# Patient Record
Sex: Female | Born: 1995 | Race: White | Hispanic: No | Marital: Married | Smoking: Never smoker
Health system: Southern US, Community
[De-identification: ages and names within clinical notes are randomized; demographics above are authoritative.]

## PROBLEM LIST (undated history)

## (undated) ENCOUNTER — Inpatient Hospital Stay (HOSPITAL_COMMUNITY): Payer: Self-pay

## (undated) ENCOUNTER — Ambulatory Visit (HOSPITAL_COMMUNITY): Admission: EM | Discharge status: Against Medical Advice | Payer: BLUE CROSS/BLUE SHIELD | Source: Ambulatory Visit

## (undated) DIAGNOSIS — Z789 Other specified health status: Secondary | ICD-10-CM

## (undated) DIAGNOSIS — I10 Essential (primary) hypertension: Secondary | ICD-10-CM

## (undated) HISTORY — PX: NO PAST SURGERIES: SHX2092

## (undated) HISTORY — DX: Other specified health status: Z78.9

---
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
## 2016-07-05 ENCOUNTER — Ambulatory Visit (INDEPENDENT_AMBULATORY_CARE_PROVIDER_SITE_OTHER): Payer: Managed Care, Other (non HMO) | Admitting: Pediatrics

## 2017-05-06 ENCOUNTER — Inpatient Hospital Stay (HOSPITAL_COMMUNITY)
Admission: AD | Admit: 2017-05-06 | Discharge: 2017-05-06 | Disposition: A | Discharge status: Home / Self Care | Payer: BLUE CROSS/BLUE SHIELD | Source: Ambulatory Visit | Attending: Obstetrics and Gynecology | Admitting: Obstetrics and Gynecology

## 2017-05-06 DIAGNOSIS — R109 Unspecified abdominal pain: Secondary | ICD-10-CM

## 2017-05-06 DIAGNOSIS — R1030 Lower abdominal pain, unspecified: Secondary | ICD-10-CM | POA: Insufficient documentation

## 2017-05-06 DIAGNOSIS — R110 Nausea: Secondary | ICD-10-CM

## 2017-05-06 LAB — COMPREHENSIVE METABOLIC PANEL
ALT: 16 U/L (ref 14–54)
AST: 20 U/L (ref 15–41)
Albumin: 4.6 g/dL (ref 3.5–5.0)
Alkaline Phosphatase: 83 U/L (ref 38–126)
Anion gap: 9 (ref 5–15)
BILIRUBIN TOTAL: 0.5 mg/dL (ref 0.3–1.2)
BUN: 13 mg/dL (ref 6–20)
CALCIUM: 9.5 mg/dL (ref 8.9–10.3)
CO2: 24 mmol/L (ref 22–32)
CREATININE: 0.73 mg/dL (ref 0.44–1.00)
Chloride: 101 mmol/L (ref 101–111)
Glucose, Bld: 105 mg/dL — ABNORMAL HIGH (ref 65–99)
Potassium: 3.7 mmol/L (ref 3.5–5.1)
Sodium: 134 mmol/L — ABNORMAL LOW (ref 135–145)
Total Protein: 7.5 g/dL (ref 6.5–8.1)

## 2017-05-06 LAB — CBC WITH DIFFERENTIAL/PLATELET
BASOS ABS: 0 10*3/uL (ref 0.0–0.1)
Basophils Relative: 0 %
EOS PCT: 1 %
Eosinophils Absolute: 0.1 10*3/uL (ref 0.0–0.7)
HEMATOCRIT: 39.6 % (ref 36.0–46.0)
Hemoglobin: 14.2 g/dL (ref 12.0–15.0)
LYMPHS ABS: 2.2 10*3/uL (ref 0.7–4.0)
LYMPHS PCT: 27 %
MCH: 28.9 pg (ref 26.0–34.0)
MCHC: 35.9 g/dL (ref 30.0–36.0)
MCV: 80.7 fL (ref 78.0–100.0)
MONO ABS: 0.3 10*3/uL (ref 0.1–1.0)
Monocytes Relative: 4 %
NEUTROS ABS: 5.6 10*3/uL (ref 1.7–7.7)
Neutrophils Relative %: 68 %
PLATELETS: 289 10*3/uL (ref 150–400)
RBC: 4.91 MIL/uL (ref 3.87–5.11)
RDW: 11.8 % (ref 11.5–15.5)
WBC: 8.3 10*3/uL (ref 4.0–10.5)

## 2017-05-06 LAB — URINALYSIS, ROUTINE W REFLEX MICROSCOPIC
BILIRUBIN URINE: NEGATIVE
GLUCOSE, UA: NEGATIVE mg/dL
HGB URINE DIPSTICK: NEGATIVE
Ketones, ur: NEGATIVE mg/dL
Leukocytes, UA: NEGATIVE
Nitrite: NEGATIVE
PH: 6 (ref 5.0–8.0)
Protein, ur: NEGATIVE mg/dL
Specific Gravity, Urine: 1.01 (ref 1.005–1.030)

## 2017-05-06 LAB — POCT PREGNANCY, URINE: PREG TEST UR: NEGATIVE

## 2017-05-06 MED ORDER — PROMETHAZINE HCL 12.5 MG PO TABS: 12.5000 mg | ORAL_TABLET | Freq: Four times a day (QID) | ORAL | 0 refills | Status: DC | PRN

## 2017-05-06 MED ORDER — KETOROLAC TROMETHAMINE 10 MG PO TABS: 10.0000 mg | ORAL_TABLET | Freq: Four times a day (QID) | ORAL | 0 refills | Status: DC | PRN

## 2017-05-06 MED ORDER — KETOROLAC TROMETHAMINE 60 MG/2ML IM SOLN
60.0000 mg | Freq: Once | INTRAMUSCULAR | Status: AC
  Administered 2017-05-06: 60 mg via INTRAMUSCULAR
  Filled 2017-05-06: qty 2

## 2017-05-06 NOTE — MAU Provider Note (Signed)
History     CSN: [id number]  Arrival date and time: 05/06/17 2000   First Provider Initiated Contact with Patient 05/06/17 2052      Chief Complaint  Patient presents with  . Abdominal Pain   HPI   [name] is a 22 y.o. female No obstetric history on file. Here in MAU with complaints of left lower quadrant pain. The pain started Thursday afternoon. The pain is in her LLQ, the pain comes and goes. When her bladder is full or she has to have a BM the pressure and pain becomes worse. Says she took 3 motrin yesterday. That helped the pain some. Says she has a history of an ovarian cyst that was the size of a quarter. It went away on own. No history of surgery for cysts. No fever.   OB History   None     No past medical history on file.  No past surgical history on file.  No family history on file.  Social History   Tobacco Use  . Smoking status: Never Smoker  . Smokeless tobacco: Never Used  Substance Use Topics  . Alcohol use: No  . Drug use: No    Allergies: No Known Allergies  Medications Prior to Admission  Medication Sig Dispense Refill Last Dose  . prednisoLONE acetate (PRED FORTE) 1 % ophthalmic suspension    Unknown at Unknown time   Results for orders placed or performed during the hospital encounter of 05/06/17 (from the past 48 hour(s))  Urinalysis, Routine w reflex microscopic     Status: Abnormal   Collection Time: 05/06/17  8:10 PM  Result Value Ref Range   Color, Urine STRAW (A) YELLOW   APPearance CLEAR CLEAR   Specific Gravity, Urine 1.010 1.005 - 1.030   pH 6.0 5.0 - 8.0   Glucose, UA NEGATIVE NEGATIVE mg/dL   Hgb urine dipstick NEGATIVE NEGATIVE   Bilirubin Urine NEGATIVE NEGATIVE   Ketones, ur NEGATIVE NEGATIVE mg/dL   Protein, ur NEGATIVE NEGATIVE mg/dL   Nitrite NEGATIVE NEGATIVE   Leukocytes, UA NEGATIVE NEGATIVE    Comment: Performed at [facility], [address], [place],  [address]  Pregnancy, urine POC      Status: None   Collection Time: 05/06/17  8:27 PM  Result Value Ref Range   Preg Test, Ur NEGATIVE NEGATIVE    Comment:        THE SENSITIVITY OF THIS METHODOLOGY IS >24 mIU/mL   CBC with Differential     Status: None   Collection Time: 05/06/17  9:07 PM  Result Value Ref Range   WBC 8.3 4.0 - 10.5 K/uL   RBC 4.91 3.87 - 5.11 MIL/uL   Hemoglobin 14.2 12.0 - 15.0 g/dL   HCT 39.6 36.0 - 46.0 %   MCV 80.7 78.0 - 100.0 fL   MCH 28.9 26.0 - 34.0 pg   MCHC 35.9 30.0 - 36.0 g/dL   RDW 11.8 11.5 - 15.5 %   Platelets 289 150 - 400 K/uL   Neutrophils Relative % 68 %   Neutro Abs 5.6 1.7 - 7.7 K/uL   Lymphocytes Relative 27 %   Lymphs Abs 2.2 0.7 - 4.0 K/uL   Monocytes Relative 4 %   Monocytes Absolute 0.3 0.1 - 1.0 K/uL   Eosinophils Relative 1 %   Eosinophils Absolute 0.1 0.0 - 0.7 K/uL   Basophils Relative 0 %   Basophils Absolute 0.0 0.0 - 0.1 K/uL    Comment: Performed at  [facility], [place], [place] [address]  Comprehensive metabolic panel     Status: Abnormal   Collection Time: 05/06/17  9:07 PM  Result Value Ref Range   Sodium 134 (L) 135 - 145 mmol/L   Potassium 3.7 3.5 - 5.1 mmol/L   Chloride 101 101 - 111 mmol/L   CO2 24 22 - 32 mmol/L   Glucose, Bld 105 (H) 65 - 99 mg/dL   BUN 13 6 - 20 mg/dL   Creatinine, Ser 0.73 0.44 - 1.00 mg/dL   Calcium 9.5 8.9 - 10.3 mg/dL   Total Protein 7.5 6.5 - 8.1 g/dL   Albumin 4.6 3.5 - 5.0 g/dL   AST 20 15 - 41 U/L   ALT 16 14 - 54 U/L   Alkaline Phosphatase 83 38 - 126 U/L   Total Bilirubin 0.5 0.3 - 1.2 mg/dL   GFR calc non Af Amer >60 >60 mL/min   GFR calc Af Amer >60 >60 mL/min    Comment: (NOTE) The eGFR has been calculated using the CKD EPI equation. This calculation has not been validated in all clinical situations. eGFR's persistently <60 mL/min signify possible Chronic Kidney Disease.    Anion gap 9 5 - 15    Comment: Performed at [facility], [address], [place], [place] [address]     Review of Systems  Constitutional: Positive for fever.  Gastrointestinal: Positive for abdominal pain. Negative for nausea and vomiting.  Genitourinary: Positive for pelvic pain. Negative for decreased urine volume, dysuria, vaginal bleeding and vaginal discharge.   Physical Exam   Blood pressure (!) 143/81, pulse 94, temperature 98.4 F (36.9 C), resp. rate 17, height 5' 4" (1.626 m), weight 154 lb 4 oz (70 kg), last menstrual period 04/19/2017, SpO2 100 %.  Physical Exam  Constitutional: She is oriented to person, place, and time. She appears well-developed and well-nourished.  Non-toxic appearance. She does not have a sickly appearance. She does not appear ill. No distress.  HENT:  Head: Normocephalic.  Eyes: Pupils are equal, round, and reactive to light.  GI: Soft. Normal appearance. There is tenderness in the suprapubic area and left lower quadrant. There is no rigidity, no rebound and no guarding.  Genitourinary:  Genitourinary Comments: Bimanual exam: Cervix closed Uterus non tender, Left adnexal tenderness  no masses bilaterally Chaperone present for exam.   Musculoskeletal: Normal range of motion.  Neurological: She is alert and oriented to person, place, and time.  Skin: Skin is warm. She is not diaphoretic.  Psychiatric: Her behavior is normal.    MAU Course  Procedures  None  MDM  CBC without leukocytosis  Toradol given 60 mg IM Patient says Toradol helped her pain tremendously. Rates her pain 1/10 down from 8/10. Patient's mother requesting [place] tonight. Discussed with Dr. Helane Rima who would like the patient to come to the office this week for follow-up and possibly in office [place]. Ok for DC home   Assessment and Plan   A:  1. Abdominal pain in female   2. Nausea     P:  Discharge home in stable condition Rx: Toradol X 3 days only, Phenergan Return to MAU if pain or symptoms worsen Call the office Monday morning to schedule follow up  Noni Saupe I, NP 05/08/2017 8:41 AM

## 2017-05-06 NOTE — MAU Note (Addendum)
Hx of intrauterine cysts-last cyst was last summer.  Left lower abdominal pain started Thursday.  Took motrin on Thursday and excedrin today (1900) for the pain-no relief.  States her leg goes numb when it gets intense.  Can feel it when she urinates.  No urgency/burning when she urinates.  LMP 04/19/2017.

## 2017-05-06 NOTE — Discharge Instructions (Signed)

## 2017-05-31 ENCOUNTER — Emergency Department (HOSPITAL_COMMUNITY)
Admission: EM | Admit: 2017-05-31 | Discharge: 2017-05-31 | Discharge status: Against Medical Advice | Payer: BLUE CROSS/BLUE SHIELD

## 2018-01-10 ENCOUNTER — Encounter (INDEPENDENT_AMBULATORY_CARE_PROVIDER_SITE_OTHER): Payer: Self-pay | Admitting: Physician Assistant

## 2018-01-10 DIAGNOSIS — Z111 Encounter for screening for respiratory tuberculosis: Secondary | ICD-10-CM

## 2018-01-11 NOTE — Progress Notes (Signed)
This encounter was created in error - please disregard.

## 2018-10-31 ENCOUNTER — Ambulatory Visit: Payer: Managed Care, Other (non HMO) | Admitting: Family Medicine

## 2018-11-02 ENCOUNTER — Encounter: Payer: Self-pay | Admitting: General Practice

## 2020-04-13 ENCOUNTER — Emergency Department (HOSPITAL_BASED_OUTPATIENT_CLINIC_OR_DEPARTMENT_OTHER): Payer: Commercial Managed Care - PPO | Admitting: Radiology

## 2020-04-13 ENCOUNTER — Encounter (HOSPITAL_BASED_OUTPATIENT_CLINIC_OR_DEPARTMENT_OTHER): Payer: Self-pay | Admitting: Emergency Medicine

## 2020-04-13 ENCOUNTER — Emergency Department (HOSPITAL_BASED_OUTPATIENT_CLINIC_OR_DEPARTMENT_OTHER): Payer: Commercial Managed Care - PPO

## 2020-04-13 ENCOUNTER — Other Ambulatory Visit: Payer: Self-pay

## 2020-04-13 ENCOUNTER — Emergency Department (HOSPITAL_BASED_OUTPATIENT_CLINIC_OR_DEPARTMENT_OTHER)
Admission: EM | Admit: 2020-04-13 | Discharge: 2020-04-13 | Disposition: A | Discharge status: Home / Self Care | Payer: Commercial Managed Care - PPO | Attending: Emergency Medicine | Admitting: Emergency Medicine

## 2020-04-13 DIAGNOSIS — R519 Headache, unspecified: Secondary | ICD-10-CM | POA: Diagnosis not present

## 2020-04-13 DIAGNOSIS — R55 Syncope and collapse: Secondary | ICD-10-CM | POA: Diagnosis not present

## 2020-04-13 DIAGNOSIS — S21311A Laceration without foreign body of right front wall of thorax with penetration into thoracic cavity, initial encounter: Secondary | ICD-10-CM | POA: Diagnosis not present

## 2020-04-13 DIAGNOSIS — S40011A Contusion of right shoulder, initial encounter: Secondary | ICD-10-CM | POA: Diagnosis not present

## 2020-04-13 DIAGNOSIS — S2191XA Laceration without foreign body of unspecified part of thorax, initial encounter: Secondary | ICD-10-CM

## 2020-04-13 DIAGNOSIS — T148XXA Other injury of unspecified body region, initial encounter: Secondary | ICD-10-CM

## 2020-04-13 DIAGNOSIS — M542 Cervicalgia: Secondary | ICD-10-CM | POA: Insufficient documentation

## 2020-04-13 DIAGNOSIS — W01198A Fall on same level from slipping, tripping and stumbling with subsequent striking against other object, initial encounter: Secondary | ICD-10-CM | POA: Diagnosis not present

## 2020-04-13 DIAGNOSIS — S299XXA Unspecified injury of thorax, initial encounter: Secondary | ICD-10-CM | POA: Diagnosis present

## 2020-04-13 LAB — CBC WITH DIFFERENTIAL/PLATELET
Abs Immature Granulocytes: 0.02 10*3/uL (ref 0.00–0.07)
Basophils Absolute: 0 10*3/uL (ref 0.0–0.1)
Basophils Relative: 1 %
Eosinophils Absolute: 0 10*3/uL (ref 0.0–0.5)
Eosinophils Relative: 1 %
HCT: 44.7 % (ref 36.0–46.0)
Hemoglobin: 15.2 g/dL — ABNORMAL HIGH (ref 12.0–15.0)
Immature Granulocytes: 0 %
Lymphocytes Relative: 23 %
Lymphs Abs: 1.3 10*3/uL (ref 0.7–4.0)
MCH: 28.8 pg (ref 26.0–34.0)
MCHC: 34 g/dL (ref 30.0–36.0)
MCV: 84.8 fL (ref 80.0–100.0)
Monocytes Absolute: 0.6 10*3/uL (ref 0.1–1.0)
Monocytes Relative: 10 %
Neutro Abs: 3.7 10*3/uL (ref 1.7–7.7)
Neutrophils Relative %: 65 %
Platelets: 260 10*3/uL (ref 150–400)
RBC: 5.27 MIL/uL — ABNORMAL HIGH (ref 3.87–5.11)
RDW: 11.6 % (ref 11.5–15.5)
WBC: 5.7 10*3/uL (ref 4.0–10.5)
nRBC: 0 % (ref 0.0–0.2)

## 2020-04-13 LAB — BASIC METABOLIC PANEL
Anion gap: 7 (ref 5–15)
BUN: 10 mg/dL (ref 6–20)
CO2: 28 mmol/L (ref 22–32)
Calcium: 9.2 mg/dL (ref 8.9–10.3)
Chloride: 104 mmol/L (ref 98–111)
Creatinine, Ser: 0.75 mg/dL (ref 0.44–1.00)
GFR, Estimated: >60 mL/min (ref >60)
Glucose, Bld: 95 mg/dL (ref 70–99)
Potassium: 3.8 mmol/L (ref 3.5–5.1)
Sodium: 139 mmol/L (ref 135–145)

## 2020-04-13 LAB — PREGNANCY, URINE: Preg Test, Ur: NEGATIVE

## 2020-04-13 MED ORDER — ONDANSETRON 4 MG PO TBDP
4.0000 mg | ORAL_TABLET | Freq: Once | ORAL | Status: AC
  Administered 2020-04-13: 4 mg via ORAL
  Filled 2020-04-13: qty 1

## 2020-04-13 MED ORDER — LIDOCAINE-EPINEPHRINE (PF) 2 %-1:200000 IJ SOLN
10.0000 mL | Freq: Once | INTRAMUSCULAR | Status: AC
  Administered 2020-04-13: 10 mL
  Filled 2020-04-13: qty 20

## 2020-04-13 NOTE — ED Triage Notes (Addendum)
Pt states  Got up and felt nauseous ,  Passed out and hit  Head  She thinks ,    And rt clavicle pain, with swelling ,  With small  Lac to rt upper chest small amt oozing at this timept states she is 9 days late with her period , pt walked  To room without difficulty,  States has been dizzy since last thursday

## 2020-04-13 NOTE — Discharge Instructions (Signed)
You were seen in the emergency department for evaluation of injuries after a fainting spell which she fell and struck your right collarbone.  X-rays of your shoulder and collarbone were negative for fracture.  CAT scan of your head and cervical spine also did not show any fractures or internal bleeding.  Your wound was sutured and these will need to be removed in 7 to 10 days.  You may get them wet.  Recommend that you drink plenty of fluids and rest today.  You can use ice to the affected areas.  Return to the emerge department if any worsening or concerning symptoms.

## 2020-04-13 NOTE — ED Provider Notes (Signed)
[facility] EMERGENCY DEPT Provider Note   CSN: [id number] Arrival date & time: 04/13/20  2633     History Chief Complaint  Patient presents with  . Fall    Bianca Mccarthy is a 25 y.o. female.  She has no significant past medical history.  She said she felt lightheaded when she woke up this morning.  She felt like she was going to pass out and then she woke up on the floor.  Complaining of headache neck pain and right shoulder pain.  Has a laceration deformity over her mid clavicle.  Currently does not feel dizzy.  Moderate throbbing headache.  She is also 9 days late on her period.  The history is provided by the patient.  Loss of Consciousness Episode history:  Single Most recent episode:  Today Progression:  Resolved Chronicity:  New Context: standing up   Witnessed: no   Relieved by:  None tried Worsened by:  Nothing Ineffective treatments:  None tried Associated symptoms: dizziness and headaches   Associated symptoms: no chest pain, no fever and no shortness of breath        History reviewed. No pertinent past medical history.  Patient Active Problem List   Diagnosis Date Noted  . 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 No pertinent surgical history.   OB History   No obstetric history on file.     History reviewed. No pertinent family history.  Social History   Tobacco Use  . Smoking status: Never Smoker  . Smokeless tobacco: Never Used  Substance Use Topics  . Alcohol use: Yes  . Drug use: No    Home Medications Prior to Admission medications   Medication Sig Start Date End Date Taking? Authorizing Provider  ketorolac (TORADOL) 10 MG tablet Take 1 tablet (10 mg total) by mouth every 6 (six) hours as needed for moderate pain. 05/06/17   Rasch, Victorino Dike I, NP  prednisoLONE acetate (PRED FORTE) 1 % ophthalmic suspension  09/21/13   [provider]  promethazine (PHENERGAN) 12.5 MG tablet Take 1 tablet (12.5  mg total) by mouth every 6 (six) hours as needed for nausea or vomiting. 05/06/17   Rasch, Harolyn Rutherford, NP    Allergies    Patient has no known allergies.  Review of Systems   Review of Systems  Constitutional: Negative for fever.  HENT: Negative for sore throat.   Eyes: Negative for visual disturbance.  Respiratory: Negative for shortness of breath.   Cardiovascular: Positive for syncope. Negative for chest pain.  Gastrointestinal: Negative for abdominal pain.  Genitourinary: Negative for dysuria.  Musculoskeletal: Positive for neck pain.  Skin: Positive for wound. Negative for rash.  Neurological: Positive for dizziness and headaches.    Physical Exam Updated Vital Signs BP (!) 131/94 (BP Location: Right Arm)   Pulse (!) 101   Temp 97.6 F (36.4 C) (Oral)   Resp 20   Ht 5\' 4"  (1.626 m)   Wt 72.6 kg   SpO2 100%   BMI 27.46 kg/m   Physical Exam Vitals and nursing note reviewed.  Constitutional:      General: She is not in acute distress.    Appearance: Normal appearance. She is well-developed.  HENT:     Head: Normocephalic and atraumatic.  Eyes:     Conjunctiva/sclera: Conjunctivae normal.  Cardiovascular:     Rate and Rhythm: Normal rate and regular rhythm.     Heart sounds: No murmur heard.   Pulmonary:  Effort: Pulmonary effort is normal. No respiratory distress.     Breath sounds: Normal breath sounds.  Abdominal:     Palpations: Abdomen is soft.     Tenderness: There is no abdominal tenderness. There is no guarding or rebound.  Musculoskeletal:        General: Tenderness and signs of injury present.     Cervical back: Neck supple.     Comments: She has approximately 3 cm superficial laceration over her mid right clavicle.  There is a hematoma there.  She has tenderness across the clavicle and right shoulder.  There is also some posterior neck pain.  Other extremities full range of motion without any pain or limitations.  Distal neurovascular intact.   Skin:    General: Skin is warm and dry.     Capillary Refill: Capillary refill takes less than 2 seconds.  Neurological:     General: No focal deficit present.     Mental Status: She is alert and oriented to person, place, and time.     Sensory: No sensory deficit.     Motor: No weakness.     Gait: Gait normal.     ED Results / Procedures / Treatments   Labs (all labs ordered are listed, but only abnormal results are displayed) Labs Reviewed  CBC WITH DIFFERENTIAL/PLATELET - Abnormal; Notable for the following components:      Result Value   RBC 5.27 (*)    Hemoglobin 15.2 (*)    All other components within normal limits  PREGNANCY, URINE  BASIC METABOLIC PANEL    EKG EKG Interpretation  Date/Time:  Monday April 13 2020 08:19:08 EDT Ventricular Rate:  105 PR Interval:  153 QRS Duration: 84 QT Interval:  327 QTC Calculation: 433 R Axis:   84 Text Interpretation: Sinus tachycardia Borderline T abnormalities, inferior leads No old tracing to compare Confirmed by Meridee Score [phone]) on 04/13/2020 8:32:12 AM   Radiology DG Clavicle Right  Result Date: 04/13/2020 CLINICAL DATA:  Recent fall with shoulder pain, initial encounter EXAM: RIGHT CLAVICLE - 2+ VIEWS COMPARISON:  None. FINDINGS: There is no evidence of fracture or other focal bone lesions. Soft tissues are unremarkable. IMPRESSION: No acute abnormality noted. Electronically Signed   By: Alcide Clever M.D.   On: 04/13/2020 09:19   DG Shoulder Right  Result Date: 04/13/2020 CLINICAL DATA:  Recent fall with shoulder pain, initial encounter EXAM: RIGHT SHOULDER - 2+ VIEW COMPARISON:  None. FINDINGS: There is no evidence of fracture or dislocation. There is no evidence of arthropathy or other focal bone abnormality. Soft tissues are unremarkable. IMPRESSION: No acute abnormality noted. Electronically Signed   By: Alcide Clever M.D.   On: 04/13/2020 09:10   CT Head Wo Contrast  Result Date: 04/13/2020 CLINICAL DATA:   Syncope, hit head EXAM: CT HEAD WITHOUT CONTRAST TECHNIQUE: Contiguous axial images were obtained from the base of the skull through the vertex without intravenous contrast. COMPARISON:  None FINDINGS: Brain: No acute intracranial abnormality. Specifically, no hemorrhage, hydrocephalus, mass lesion, acute infarction, or significant intracranial injury. Vascular: No hyperdense vessel or unexpected calcification. Skull: No acute calvarial abnormality. Sinuses/Orbits: No acute findings Other: None IMPRESSION: No acute intracranial abnormality. Electronically Signed   By: Charlett Nose M.D.   On: 04/13/2020 09:32   CT Cervical Spine Wo Contrast  Result Date: 04/13/2020 CLINICAL DATA:  Syncope EXAM: CT CERVICAL SPINE WITHOUT CONTRAST TECHNIQUE: Multidetector CT imaging of the cervical spine was performed without intravenous contrast. Multiplanar CT image  reconstructions were also generated. COMPARISON:  None. FINDINGS: Alignment: Loss of cervical lordosis. Slight anterolisthesis of C3 on C4 and C4 on C5. Skull base and vertebrae: No fracture or focal bone lesion. Soft tissues and spinal canal: No prevertebral fluid or swelling. No visible canal hematoma. Disc levels:  Maintained Upper chest: Negative Other: None IMPRESSION: Loss of cervical lordosis with slight anterolisthesis at C3-4 and C4-5. This may be related to muscle spasm. Consider further evaluation with flexion and extension views to exclude instability. No focal bone abnormality. Electronically Signed   By: Charlett Nose M.D.   On: 04/13/2020 09:34    Procedures .Marland KitchenLaceration Repair  Date/Time: 04/13/2020 9:46 AM Performed by: Terrilee Files, MD Authorized by: Terrilee Files, MD   Consent:    Consent obtained:  Verbal   Consent given by:  Patient   Risks discussed:  Infection, pain, poor cosmetic result, poor wound healing and retained foreign body   Alternatives discussed:  No treatment and delayed treatment Universal protocol:     Procedure explained and questions answered to patient or proxy's satisfaction: yes   Anesthesia:    Anesthesia method:  Local infiltration   Local anesthetic:  Lidocaine 2% WITH epi Laceration details:    Location:  Trunk   Trunk location:  R chest   Length (cm):  3 Pre-procedure details:    Preparation:  Patient was prepped and draped in usual sterile fashion Treatment:    Area cleansed with:  Saline   Amount of cleaning:  Standard   Irrigation solution:  Sterile saline   Debridement:  None Skin repair:    Repair method:  Sutures   Suture size:  5-0 and 4-0   Suture material:  Nylon   Suture technique:  Simple interrupted Approximation:    Approximation:  Close Repair type:    Repair type:  Simple Post-procedure details:    Dressing:  Non-adherent dressing   Procedure completion:  Tolerated well, no immediate complications     Medications Ordered in ED Medications  ondansetron (ZOFRAN-ODT) disintegrating tablet 4 mg (4 mg Oral Given 04/13/20 0835)  lidocaine-EPINEPHrine (XYLOCAINE W/EPI) 2 %-1:200000 (PF) injection 10 mL (10 mLs Infiltration Given 04/13/20 1020)    ED Course  I have reviewed the triage vital signs and the nursing notes.  Pertinent labs & imaging results that were available during my care of the patient were reviewed by me and considered in my medical decision making (see chart for details).  Clinical Course as of 04/13/20 0946  Mon Apr 13, 2020  [id number] Imaging ordered and interpreted by me as no acute fractures, no intracranial bleed.  Reviewed with patient.  She received some Zofran for nausea and she says that is improving [MB]    Clinical Course User Index [MB] Terrilee Files, MD   MDM Rules/Calculators/A&P                         This patient complains of syncope, fall, right clavicle injury, headache, neck pain; this involves an extensive number of treatment Options and is a complaint that carries with it a high risk of complications  and Morbidity. The differential includes fracture, contusion, dislocation, laceration, intracranial bleed, arrhythmia, anemia, metabolic derangement  I ordered, reviewed and interpreted labs, which included CBC unremarkable, chemistries unremarkable, pregnancy test negative I ordered medication oral Zofran with improvement in nausea I ordered imaging studies which included right shoulder and clavicle, CT head and cervical spine and I independently  visualized and interpreted imaging which showed no acute findings Additional history obtained from patient's husband Previous records obtained and reviewed in epic, no recent visits  After the interventions stated above, I reevaluated the patient and found patient to be symptomatically improved.  She is ambulated in the department without any difficulty.  She is also tolerated p.o.  Return instructions discussed.    Final Clinical Impression(s) / ED Diagnoses Final diagnoses:  Syncope, unspecified syncope type  Contusion of right clavicle, initial encounter  Laceration of chest, initial encounter    Rx / DC Orders ED Discharge Orders    None       Terrilee FilesButler, Courtlynn Holloman C, MD 04/13/20 1103

## 2021-07-16 LAB — OB RESULTS CONSOLE RUBELLA ANTIBODY, IGM: Rubella: NON-IMMUNE/NOT IMMUNE

## 2021-07-26 LAB — OB RESULTS CONSOLE HIV ANTIBODY (ROUTINE TESTING): HIV: NONREACTIVE

## 2021-07-26 LAB — HEPATITIS C ANTIBODY: HCV Ab: NEGATIVE

## 2021-07-26 LAB — OB RESULTS CONSOLE HEPATITIS B SURFACE ANTIGEN: Hepatitis B Surface Ag: NEGATIVE

## 2021-07-26 LAB — OB RESULTS CONSOLE RPR: RPR: NONREACTIVE

## 2021-08-05 LAB — OB RESULTS CONSOLE GC/CHLAMYDIA
Chlamydia: NEGATIVE
Neisseria Gonorrhea: NEGATIVE

## 2021-09-19 ENCOUNTER — Inpatient Hospital Stay (HOSPITAL_COMMUNITY)
Admission: AD | Admit: 2021-09-19 | Discharge: 2021-09-19 | Disposition: A | Discharge status: Home / Self Care | Payer: Commercial Managed Care - PPO | Attending: Obstetrics and Gynecology | Admitting: Obstetrics and Gynecology

## 2021-09-19 ENCOUNTER — Encounter: Payer: Self-pay | Admitting: Student

## 2021-09-19 DIAGNOSIS — R030 Elevated blood-pressure reading, without diagnosis of hypertension: Secondary | ICD-10-CM | POA: Insufficient documentation

## 2021-09-19 DIAGNOSIS — Z3A16 16 weeks gestation of pregnancy: Secondary | ICD-10-CM | POA: Insufficient documentation

## 2021-09-19 DIAGNOSIS — O162 Unspecified maternal hypertension, second trimester: Secondary | ICD-10-CM | POA: Diagnosis not present

## 2021-09-19 DIAGNOSIS — O26892 Other specified pregnancy related conditions, second trimester: Secondary | ICD-10-CM | POA: Insufficient documentation

## 2021-09-19 DIAGNOSIS — O1202 Gestational edema, second trimester: Secondary | ICD-10-CM | POA: Insufficient documentation

## 2021-09-19 DIAGNOSIS — O10912 Unspecified pre-existing hypertension complicating pregnancy, second trimester: Secondary | ICD-10-CM | POA: Insufficient documentation

## 2021-09-19 LAB — URINALYSIS, ROUTINE W REFLEX MICROSCOPIC
Bilirubin Urine: NEGATIVE
Glucose, UA: 50 mg/dL — AB
Hgb urine dipstick: NEGATIVE
Ketones, ur: NEGATIVE mg/dL
Leukocytes,Ua: NEGATIVE
Nitrite: NEGATIVE
Protein, ur: NEGATIVE mg/dL
Specific Gravity, Urine: 1.013 (ref 1.005–1.030)
pH: 6 (ref 5.0–8.0)

## 2021-09-19 LAB — CBC
HCT: 34.3 % — ABNORMAL LOW (ref 36.0–46.0)
Hemoglobin: 12 g/dL (ref 12.0–15.0)
MCH: 29.4 pg (ref 26.0–34.0)
MCHC: 35 g/dL (ref 30.0–36.0)
MCV: 84.1 fL (ref 80.0–100.0)
Platelets: 226 10*3/uL (ref 150–400)
RBC: 4.08 MIL/uL (ref 3.87–5.11)
RDW: 12.2 % (ref 11.5–15.5)
WBC: 11.1 10*3/uL — ABNORMAL HIGH (ref 4.0–10.5)
nRBC: 0 % (ref 0.0–0.2)

## 2021-09-19 LAB — COMPREHENSIVE METABOLIC PANEL
ALT: 20 U/L (ref 0–44)
AST: 17 U/L (ref 15–41)
Albumin: 2.9 g/dL — ABNORMAL LOW (ref 3.5–5.0)
Alkaline Phosphatase: 61 U/L (ref 38–126)
Anion gap: 8 (ref 5–15)
BUN: 7 mg/dL (ref 6–20)
CO2: 23 mmol/L (ref 22–32)
Calcium: 8.8 mg/dL — ABNORMAL LOW (ref 8.9–10.3)
Chloride: 108 mmol/L (ref 98–111)
Creatinine, Ser: 0.51 mg/dL (ref 0.44–1.00)
GFR, Estimated: >60 mL/min (ref >60)
Glucose, Bld: 91 mg/dL (ref 70–99)
Potassium: 4 mmol/L (ref 3.5–5.1)
Sodium: 139 mmol/L (ref 135–145)
Total Bilirubin: <0.1 mg/dL — ABNORMAL LOW (ref 0.3–1.2)
Total Protein: 5.7 g/dL — ABNORMAL LOW (ref 6.5–8.1)

## 2021-09-19 NOTE — MAU Note (Addendum)
.  Bianca Mccarthy is a 26 y.o. at [redacted]w[redacted]d here in MAU reporting: Pt states was at dinner and after dinner noted pretibial and pedal edema. Which she states has been present 3-4 days but feels that it is moving up her legs. Negative homans bilaterally. Stopped at [facility] to take BP - 160/95. "Slight" HA, x 2 days  Hsd noy taken any medication for HA.Denies visual changes or epigastric pain. Also reports dizziness x 2 days, denies syncope. Pt reports anxiety because of family history of pre eclampsia with pts mother and grandmother.  Pain score: 0 Vitals:   09/19/21 2212  BP: (!) 142/86  Pulse: (!) 109  Resp: 17  Temp: 98.4 F (36.9 C)  SpO2: 100%     FHT:154 Lab orders placed from triage:   None

## 2021-09-19 NOTE — Discharge Instructions (Signed)

## 2021-09-19 NOTE — MAU Provider Note (Signed)
History     CSN: [id number]  Arrival date and time: 09/19/21 2132   Event Date/Time   First Provider Initiated Contact with Patient 09/19/21 2229      Chief Complaint  Patient presents with   Hypertension   HPI  Bianca Mccarthy is a 26 y.o. G1P0 at [redacted]w[redacted]d who presents for evaluation of elevated blood pressure. Patient reports she has had a headache on and off for the last 3 days. Patient rates the pain as a 4/10 and has not tried anything for the pain. She states she went to [facility] and checked her blood pressure and it was in the 160s/90s. She reports her mother has a "long history of preeclampsia" so she told her to come to the hospital. She denies any visual changes or epigastric pain. She reports she has worsening lower extremity edema.  She denies any vaginal bleeding, discharge, and leaking of fluid. Denies any constipation, diarrhea or any urinary complaints. Reports normal fetal movement.   OB History     Gravida  1   Para      Term      Preterm      AB      Living         SAB      IAB      Ectopic      Multiple      Live Births              Past Medical History:  Diagnosis Date   Medical history non-contributory     Past Surgical History:  Procedure Laterality Date   NO PAST SURGERIES      Family History  Problem Relation Age of Onset   Hypertension Mother    Hypertension Maternal Grandmother     Social History   Tobacco Use   Smoking status: Never   Smokeless tobacco: Never  Substance Use Topics   Alcohol use: Yes   Drug use: No    Allergies: No Known Allergies  Medications Prior to Admission  Medication Sig Dispense Refill Last Dose   Prenatal Vit-Fe Fumarate-FA (PRENATAL VITAMIN PO) Take 1 tablet by mouth daily.      ketorolac (TORADOL) 10 MG tablet Take 1 tablet (10 mg total) by mouth every 6 (six) hours as needed for moderate pain. 12 tablet 0    prednisoLONE acetate (PRED FORTE) 1 % ophthalmic suspension        promethazine (PHENERGAN) 12.5 MG tablet Take 1 tablet (12.5 mg total) by mouth every 6 (six) hours as needed for nausea or vomiting. 10 tablet 0     Review of Systems  Constitutional: Negative.  Negative for fatigue and fever.  HENT: Negative.    Respiratory: Negative.  Negative for shortness of breath.   Cardiovascular: Negative.  Negative for chest pain.  Gastrointestinal: Negative.  Negative for abdominal pain, constipation, diarrhea, nausea and vomiting.  Genitourinary: Negative.  Negative for dysuria, vaginal bleeding and vaginal discharge.  Neurological: Negative.  Negative for dizziness and headaches.   Physical Exam   Blood pressure 138/88, pulse (!) 103, temperature 98.4 F (36.9 C), temperature source Oral, resp. rate 17, height 5\' 4"  (1.626 m), weight 91.6 kg, last menstrual period 05/26/2021, SpO2 100 %.  Patient Vitals for the past 24 hrs:  BP Temp Temp src Pulse Resp SpO2 Height Weight  09/19/21 2300 138/88 -- -- (!) 103 -- 100 % -- --  09/19/21 2245 125/78 -- -- 98 -- 99 % -- --  09/19/21 2234 132/89 -- -- (!) 104 -- -- -- --  09/19/21 2229 137/85 -- -- (!) 105 -- -- -- --  09/19/21 2212 (!) 142/86 98.4 F (36.9 C) Oral (!) 109 17 100 % 5\' 4"  (1.626 m) 91.6 kg    Physical Exam Vitals and nursing note reviewed.  Constitutional:      General: She is not in acute distress.    Appearance: She is well-developed.  HENT:     Head: Normocephalic.  Eyes:     Pupils: Pupils are equal, round, and reactive to light.  Cardiovascular:     Rate and Rhythm: Normal rate and regular rhythm.     Heart sounds: Normal heart sounds.  Pulmonary:     Effort: Pulmonary effort is normal. No respiratory distress.     Breath sounds: Normal breath sounds.  Abdominal:     General: Bowel sounds are normal. There is no distension.     Palpations: Abdomen is soft.     Tenderness: There is no abdominal tenderness.  Skin:    General: Skin is warm and dry.  Neurological:     Mental  Status: She is alert and oriented to person, place, and time.  Psychiatric:        Mood and Affect: Mood normal.        Behavior: Behavior normal.        Thought Content: Thought content normal.        Judgment: Judgment normal.     FHT: 154 bpm  MAU Course  Procedures  Results for orders placed or performed during the hospital encounter of 09/19/21 (from the past 24 hour(s))  Urinalysis, Routine w reflex microscopic Urine, Clean Catch     Status: Abnormal   Collection Time: 09/19/21  9:58 PM  Result Value Ref Range   Color, Urine YELLOW YELLOW   APPearance CLEAR CLEAR   Specific Gravity, Urine 1.013 1.005 - 1.030   pH 6.0 5.0 - 8.0   Glucose, UA 50 (A) NEGATIVE mg/dL   Hgb urine dipstick NEGATIVE NEGATIVE   Bilirubin Urine NEGATIVE NEGATIVE   Ketones, ur NEGATIVE NEGATIVE mg/dL   Protein, ur NEGATIVE NEGATIVE mg/dL   Nitrite NEGATIVE NEGATIVE   Leukocytes,Ua NEGATIVE NEGATIVE  CBC     Status: Abnormal   Collection Time: 09/19/21 10:50 PM  Result Value Ref Range   WBC 11.1 (H) 4.0 - 10.5 K/uL   RBC 4.08 3.87 - 5.11 MIL/uL   Hemoglobin 12.0 12.0 - 15.0 g/dL   HCT 09/21/21 (L) 78.6 - 76.7 %   MCV 84.1 80.0 - 100.0 fL   MCH 29.4 26.0 - 34.0 pg   MCHC 35.0 30.0 - 36.0 g/dL   RDW 20.9 47.0 - 96.2 %   Platelets 226 150 - 400 K/uL   nRBC 0.0 0.0 - 0.2 %  Comprehensive metabolic panel     Status: Abnormal   Collection Time: 09/19/21 10:50 PM  Result Value Ref Range   Sodium 139 135 - 145 mmol/L   Potassium 4.0 3.5 - 5.1 mmol/L   Chloride 108 98 - 111 mmol/L   CO2 23 22 - 32 mmol/L   Glucose, Bld 91 70 - 99 mg/dL   BUN 7 6 - 20 mg/dL   Creatinine, Ser 09/21/21 0.44 - 1.00 mg/dL   Calcium 8.8 (L) 8.9 - 10.3 mg/dL   Total Protein 5.7 (L) 6.5 - 8.1 g/dL   Albumin 2.9 (L) 3.5 - 5.0 g/dL   AST 17 15 - 41  U/L   ALT 20 0 - 44 U/L   Alkaline Phosphatase 61 38 - 126 U/L   Total Bilirubin <0.1 (L) 0.3 - 1.2 mg/dL   GFR, Estimated >62 >94 mL/min   Anion gap 8 5 - 15   MDM Prenatal  records from community office reviewed. Pregnancy complicated by uncomplicated. Labs ordered and reviewed.   UA Long discussion about preeclampsia and HTN in pregnancy. Discussed management of hypertension at this gestation. Patient states she would feel reassured with baseline labs tonight.   CBC, CMP Patient declines any treatment for headache while in MAU  Assessment and Plan   1. Elevated blood pressure affecting pregnancy in second trimester, antepartum   2. [redacted] weeks gestation of pregnancy     -Discharge home in stable condition -Hypertension precautions discussed -Patient advised to follow-up with OB as scheduled for prenatal care -Patient may return to MAU as needed or if her condition were to change or worsen  Rolm Bookbinder, CNM 09/19/2021, 11:37 PM

## 2021-09-22 ENCOUNTER — Encounter (HOSPITAL_COMMUNITY): Payer: Self-pay | Admitting: Obstetrics and Gynecology

## 2021-09-22 ENCOUNTER — Other Ambulatory Visit: Payer: Self-pay

## 2021-09-22 ENCOUNTER — Inpatient Hospital Stay (HOSPITAL_COMMUNITY)
Admission: AD | Admit: 2021-09-22 | Discharge: 2021-09-22 | Disposition: A | Discharge status: Home / Self Care | Payer: Commercial Managed Care - PPO | Attending: Obstetrics and Gynecology | Admitting: Obstetrics and Gynecology

## 2021-09-22 DIAGNOSIS — O10912 Unspecified pre-existing hypertension complicating pregnancy, second trimester: Secondary | ICD-10-CM | POA: Insufficient documentation

## 2021-09-22 DIAGNOSIS — O26892 Other specified pregnancy related conditions, second trimester: Secondary | ICD-10-CM | POA: Diagnosis present

## 2021-09-22 DIAGNOSIS — O10919 Unspecified pre-existing hypertension complicating pregnancy, unspecified trimester: Secondary | ICD-10-CM

## 2021-09-22 DIAGNOSIS — Z3A17 17 weeks gestation of pregnancy: Secondary | ICD-10-CM | POA: Insufficient documentation

## 2021-09-22 DIAGNOSIS — R519 Headache, unspecified: Secondary | ICD-10-CM | POA: Diagnosis not present

## 2021-09-22 NOTE — MAU Provider Note (Signed)
History     CSN: [id number]  Arrival date and time: 09/22/21 1112   Event Date/Time   First Provider Initiated Contact with Patient 09/22/21 1151      Chief Complaint  Patient presents with   Hypertension   Headache   Bianca Mccarthy is a 26 y.o. year old G30P0 female at [redacted]w[redacted]d weeks gestation who was sent to MAU from [facility] for severe range BPs in the office; 160s of 90s and 100s x 3-4 readings. The patient was seen in MAU on 09/19/2021 with elevated BPs. She had PEC that day and today in the office. She was sent a Rx for Labetalol. She is unaware of the dosage, because she has not picked it up yet. She came straight here from the office as instructed. She also reports a H/A; rated 4/10. She receives [facility] with [facility]. Her mother is present and contributing to the history taking. Her mother adds that she herself had PEC and "all the maternity complications you can have with every one of my pregnancies."   OB History     Gravida  1   Para      Term      Preterm      AB      Living         SAB      IAB      Ectopic      Multiple      Live Births              Past Medical History:  Diagnosis Date   Medical history non-contributory     Past Surgical History:  Procedure Laterality Date   NO PAST SURGERIES      Family History  Problem Relation Age of Onset   Hypertension Mother    Hypertension Maternal Grandmother     Social History   Tobacco Use   Smoking status: Never   Smokeless tobacco: Never  Substance Use Topics   Alcohol use: Yes   Drug use: No    Allergies: No Known Allergies  Medications Prior to Admission  Medication Sig Dispense Refill Last Dose   Prenatal Vit-Fe Fumarate-FA (PRENATAL VITAMIN PO) Take 1 tablet by mouth daily.   09/21/2021   promethazine (PHENERGAN) 12.5 MG tablet Take 1 tablet (12.5 mg total) by mouth every 6 (six) hours as needed for nausea or vomiting. 10 tablet 0     Review of  Systems  Constitutional: Negative.   HENT: Negative.    Eyes: Negative.   Respiratory: Negative.    Cardiovascular: Negative.  Negative for leg swelling ("no swelling today, but I had pitting edema on Sunday").  Gastrointestinal: Negative.   Endocrine: Negative.   Genitourinary: Negative.   Musculoskeletal: Negative.   Skin: Negative.   Allergic/Immunologic: Negative.   Neurological: Negative.   Hematological: Negative.   Psychiatric/Behavioral: Negative.     Physical Exam   Patient Vitals for the past 24 hrs:  BP Temp Temp src Pulse Resp SpO2 Height Weight  09/22/21 1300 130/73 -- -- (!) 106 -- 98 % -- --  09/22/21 1245 129/77 -- -- (!) 101 -- 99 % -- --  09/22/21 1230 118/69 -- -- 100 -- 99 % -- --  09/22/21 1216 122/78 -- -- (!) 105 -- -- -- --  09/22/21 1201 127/85 -- -- (!) 107 -- -- -- --  09/22/21 1146 127/76 -- -- (!) 106 -- -- -- --  09/22/21 1141 133/77 -- -- Marland Kitchen  115 -- -- -- --  09/22/21 1126 (!) 150/79 98.2 F (36.8 C) Oral (!) 112 16 99 % -- --  09/22/21 1122 -- -- -- -- -- -- 5\' 4"  (1.626 m) 91.7 kg    Physical Exam Vitals and nursing note reviewed.  Constitutional:      Appearance: Normal appearance. She is normal weight.  Cardiovascular:     Rate and Rhythm: Regular rhythm. Tachycardia present.     Heart sounds: Normal heart sounds.  Pulmonary:     Effort: Pulmonary effort is normal.     Breath sounds: Normal breath sounds.  Abdominal:     General: Bowel sounds are normal.     Palpations: Abdomen is soft.  Genitourinary:    Comments: deferred Musculoskeletal:        General: Normal range of motion.  Skin:    General: Skin is warm and dry.  Neurological:     Mental Status: She is alert and oriented to person, place, and time.  Psychiatric:        Mood and Affect: Mood normal.        Behavior: Behavior normal.        Thought Content: Thought content normal.        Judgment: Judgment normal.    FHTs by doppler: 150 bpm  MAU Course   Procedures  MDM Serial BPs  *TC to Dr. @ 1300 - notified of patient's complaints, assessments, & serial BPs. Asked if she still wanted patient to start Labetalol in the setting of low range  Assessment and Plan  1. Chronic hypertension affecting pregnancy - Advised to pick up Labetalol and take as directed by Dr. Elon Spanner - Information provided on cHTN in pregnancy, how to take your BP, managing HTN, preventing HTN, and a BP record sheet   2. [redacted] weeks gestation of pregnancy   - Discharge patient - Keep scheduled appt with [facility] - Patient verbalized an understanding of the plan of care and agrees.    Elon Spanner, CNM 09/22/2021, 11:51 AM

## 2021-09-22 NOTE — MAU Note (Signed)
Bianca Mccarthy is a 26 y.o. at [redacted]w[redacted]d here in MAU reporting: was sent over from the office because BP was high in the office. It was 162/78.  Onset of complaint: ongoing  Pain score: 4/10 headache  Vitals:   09/22/21 1126  BP: (!) 150/79  Pulse: (!) 112  Resp: 16  Temp: 98.2 F (36.8 C)  SpO2: 99%     FHT:150  Lab orders placed from triage: none

## 2021-12-06 DIAGNOSIS — O24419 Gestational diabetes mellitus in pregnancy, unspecified control: Secondary | ICD-10-CM | POA: Insufficient documentation

## 2021-12-17 ENCOUNTER — Other Ambulatory Visit: Payer: Self-pay

## 2021-12-17 ENCOUNTER — Encounter (HOSPITAL_COMMUNITY): Payer: Self-pay | Admitting: Obstetrics and Gynecology

## 2021-12-17 ENCOUNTER — Inpatient Hospital Stay (HOSPITAL_COMMUNITY)
Admission: AD | Admit: 2021-12-17 | Discharge: 2021-12-17 | Disposition: A | Discharge status: Home / Self Care | Payer: Commercial Managed Care - PPO | Attending: Obstetrics and Gynecology | Admitting: Obstetrics and Gynecology

## 2021-12-17 DIAGNOSIS — R519 Headache, unspecified: Secondary | ICD-10-CM | POA: Insufficient documentation

## 2021-12-17 DIAGNOSIS — Z3A29 29 weeks gestation of pregnancy: Secondary | ICD-10-CM | POA: Diagnosis not present

## 2021-12-17 DIAGNOSIS — O10013 Pre-existing essential hypertension complicating pregnancy, third trimester: Secondary | ICD-10-CM | POA: Diagnosis present

## 2021-12-17 DIAGNOSIS — O26893 Other specified pregnancy related conditions, third trimester: Secondary | ICD-10-CM | POA: Diagnosis not present

## 2021-12-17 DIAGNOSIS — O10913 Unspecified pre-existing hypertension complicating pregnancy, third trimester: Secondary | ICD-10-CM

## 2021-12-17 DIAGNOSIS — Z6741 Type O blood, Rh negative: Secondary | ICD-10-CM | POA: Insufficient documentation

## 2021-12-17 DIAGNOSIS — O09899 Supervision of other high risk pregnancies, unspecified trimester: Secondary | ICD-10-CM | POA: Insufficient documentation

## 2021-12-17 DIAGNOSIS — I10 Essential (primary) hypertension: Secondary | ICD-10-CM | POA: Insufficient documentation

## 2021-12-17 HISTORY — DX: Essential (primary) hypertension: I10

## 2021-12-17 LAB — CBC
HCT: 32.1 % — ABNORMAL LOW (ref 36.0–46.0)
Hemoglobin: 10.7 g/dL — ABNORMAL LOW (ref 12.0–15.0)
MCH: 26.8 pg (ref 26.0–34.0)
MCHC: 33.3 g/dL (ref 30.0–36.0)
MCV: 80.5 fL (ref 80.0–100.0)
Platelets: 240 10*3/uL (ref 150–400)
RBC: 3.99 MIL/uL (ref 3.87–5.11)
RDW: 13.2 % (ref 11.5–15.5)
WBC: 10 10*3/uL (ref 4.0–10.5)
nRBC: 0 % (ref 0.0–0.2)

## 2021-12-17 LAB — COMPREHENSIVE METABOLIC PANEL
ALT: 13 U/L (ref 0–44)
AST: 14 U/L — ABNORMAL LOW (ref 15–41)
Albumin: 2.7 g/dL — ABNORMAL LOW (ref 3.5–5.0)
Alkaline Phosphatase: 89 U/L (ref 38–126)
Anion gap: 8 (ref 5–15)
BUN: 5 mg/dL — ABNORMAL LOW (ref 6–20)
CO2: 22 mmol/L (ref 22–32)
Calcium: 8.5 mg/dL — ABNORMAL LOW (ref 8.9–10.3)
Chloride: 103 mmol/L (ref 98–111)
Creatinine, Ser: 0.68 mg/dL (ref 0.44–1.00)
GFR, Estimated: >60 mL/min (ref >60)
Glucose, Bld: 104 mg/dL — ABNORMAL HIGH (ref 70–99)
Potassium: 4.2 mmol/L (ref 3.5–5.1)
Sodium: 133 mmol/L — ABNORMAL LOW (ref 135–145)
Total Bilirubin: 0.2 mg/dL — ABNORMAL LOW (ref 0.3–1.2)
Total Protein: 5.6 g/dL — ABNORMAL LOW (ref 6.5–8.1)

## 2021-12-17 LAB — PROTEIN / CREATININE RATIO, URINE
Creatinine, Urine: 143 mg/dL
Protein Creatinine Ratio: 0.12 mg/mg{Cre} (ref 0.00–0.15)
Total Protein, Urine: 17 mg/dL

## 2021-12-17 MED ORDER — CYCLOBENZAPRINE HCL 10 MG PO TABS: 10.0000 mg | ORAL_TABLET | Freq: Three times a day (TID) | ORAL | 0 refills | Status: DC | PRN

## 2021-12-17 MED ORDER — CYCLOBENZAPRINE HCL 5 MG PO TABS
10.0000 mg | ORAL_TABLET | Freq: Once | ORAL | Status: AC
  Administered 2021-12-17: 10 mg via ORAL
  Filled 2021-12-17: qty 2

## 2021-12-17 MED ORDER — ACETAMINOPHEN-CAFFEINE 500-65 MG PO TABS
2.0000 | ORAL_TABLET | Freq: Once | ORAL | Status: AC
  Administered 2021-12-17: 2 via ORAL
  Filled 2021-12-17: qty 2

## 2021-12-17 NOTE — MAU Provider Note (Signed)
History     [id number]  Arrival date and time: 12/17/21 1145    Chief Complaint  Patient presents with   Headache   Decreased Fetal Movement   Hypertension     HPI Bianca Mccarthy is a 26 y.o. at [redacted]w[redacted]d who presents for BP evaluation. Was started on labetalol at 17 weeks due to chronic hypertension. Was in office today & saw Dr. Henderson Cloud. BP was elevated at home earlier but normal in the office. Reports headache & intermittent blurred vision since this morning. Took 500 mg of tylenol early this morning without relief. Currently rates headache 5/10. Denies epigastric pain. Reports decreased fetal movement.    OB History     Gravida  1   Para      Term      Preterm      AB      Living         SAB      IAB      Ectopic      Multiple      Live Births              Past Medical History:  Diagnosis Date   Chronic hypertension     Past Surgical History:  Procedure Laterality Date   NO PAST SURGERIES      Family History  Problem Relation Age of Onset   Hypertension Mother    Hypertension Maternal Grandmother     No Known Allergies  No current facility-administered medications on file prior to encounter.   Current Outpatient Medications on File Prior to Encounter  Medication Sig Dispense Refill   labetalol (NORMODYNE) 200 MG tablet Take 200 mg by mouth 2 (two) times daily.     Prenatal Vit-Fe Fumarate-FA (PRENATAL VITAMIN PO) Take 1 tablet by mouth daily.     promethazine (PHENERGAN) 12.5 MG tablet Take 1 tablet (12.5 mg total) by mouth every 6 (six) hours as needed for nausea or vomiting. 10 tablet 0     ROS Pertinent positives and negative per HPI, all others reviewed and negative  Physical Exam   BP 109/68   Pulse (!) 102   Temp 98.6 F (37 C) (Oral)   Resp 18   Ht 5\' 4"  (1.626 m)   Wt 105 kg   LMP 05/26/2021   SpO2 97%   BMI 39.72 kg/m   Patient Vitals for the past 24 hrs:  BP Temp Temp src Pulse Resp SpO2 Height Weight   12/17/21 1331 109/68 -- -- (!) 102 -- -- -- --  12/17/21 1316 114/69 -- -- (!) 102 -- -- -- --  12/17/21 1301 108/61 -- -- 100 -- -- -- --  12/17/21 1246 117/62 -- -- (!) 106 -- -- -- --  12/17/21 1231 120/67 -- -- (!) 105 -- -- -- --  12/17/21 1220 -- -- -- -- -- 97 % -- --  12/17/21 1216 110/60 -- -- (!) 109 -- -- -- --  12/17/21 1209 115/63 98.6 F (37 C) Oral (!) 107 18 -- -- --  12/17/21 1152 -- -- -- -- -- -- 5\' 4"  (1.626 m) 105 kg    Physical Exam Vitals and nursing note reviewed.  Constitutional:      General: She is not in acute distress.    Appearance: She is well-developed. She is not ill-appearing.  HENT:     Head: Normocephalic and atraumatic.  Eyes:     General: No scleral icterus.    Pupils: Pupils are  equal, round, and reactive to light.  Pulmonary:     Effort: Pulmonary effort is normal. No respiratory distress.  Neurological:     Mental Status: She is alert.  Psychiatric:        Mood and Affect: Mood normal.        Behavior: Behavior normal.       FHT Baseline 135, moderate variability, 15x15 accels, no decels Toco: none Cat: 1  Labs Results for orders placed or performed during the hospital encounter of 12/17/21 (from the past 24 hour(s))  CBC     Status: Abnormal   Collection Time: 12/17/21 12:04 PM  Result Value Ref Range   WBC 10.0 4.0 - 10.5 K/uL   RBC 3.99 3.87 - 5.11 MIL/uL   Hemoglobin 10.7 (L) 12.0 - 15.0 g/dL   HCT 70.3 (L) 50.0 - 93.8 %   MCV 80.5 80.0 - 100.0 fL   MCH 26.8 26.0 - 34.0 pg   MCHC 33.3 30.0 - 36.0 g/dL   RDW 18.2 99.3 - 71.6 %   Platelets 240 150 - 400 K/uL   nRBC 0.0 0.0 - 0.2 %  Comprehensive metabolic panel     Status: Abnormal   Collection Time: 12/17/21 12:04 PM  Result Value Ref Range   Sodium 133 (L) 135 - 145 mmol/L   Potassium 4.2 3.5 - 5.1 mmol/L   Chloride 103 98 - 111 mmol/L   CO2 22 22 - 32 mmol/L   Glucose, Bld 104 (H) 70 - 99 mg/dL   BUN 5 (L) 6 - 20 mg/dL   Creatinine, Ser 9.67 0.44 - 1.00  mg/dL   Calcium 8.5 (L) 8.9 - 10.3 mg/dL   Total Protein 5.6 (L) 6.5 - 8.1 g/dL   Albumin 2.7 (L) 3.5 - 5.0 g/dL   AST 14 (L) 15 - 41 U/L   ALT 13 0 - 44 U/L   Alkaline Phosphatase 89 38 - 126 U/L   Total Bilirubin 0.2 (L) 0.3 - 1.2 mg/dL   GFR, Estimated >89 >38 mL/min   Anion gap 8 5 - 15  Protein / creatinine ratio, urine     Status: None   Collection Time: 12/17/21 12:33 PM  Result Value Ref Range   Creatinine, Urine 143 mg/dL   Total Protein, Urine 17 mg/dL   Protein Creatinine Ratio 0.12 0.00 - 0.15 mg/mg[Cre]    Imaging No results found.  MAU Course  Procedures Lab Orders         CBC         Comprehensive metabolic panel         Protein / creatinine ratio, urine    Meds ordered this encounter  Medications   acetaminophen-caffeine (EXCEDRIN TENSION HEADACHE) 500-65 MG per tablet 2 tablet   cyclobenzaprine (FLEXERIL) tablet 10 mg   cyclobenzaprine (FLEXERIL) 10 MG tablet    Sig: Take 1 tablet (10 mg total) by mouth 3 (three) times daily as needed (headache).    Dispense:  30 tablet    Refill:  0    Order Specific Question:   Supervising Provider    Answer:   Milas Hock [[id number]]   Imaging Orders  No imaging studies ordered today    MDM Patient sent from office for preeclampsia evaluation Remained normotensive in MAU & preeclampsia labs normal.  Headache treated with excedrin tension & flexeril with moderate improvement Fetal tracing appropriate for gestation with patient reports good movement in MAU Assessment and Plan   1. Chronic hypertension complicating or  reason for care during pregnancy, third trimester   2. Pregnancy headache in third trimester   3. [redacted] weeks gestation of pregnancy    -Rx flexeril prn -Continue antihypertensives as previously prescribed -Reviewed preeclampsia precautions & reasons to return to MAU -Keep f/u appt with OB on Monday   Judeth Horn, NP 12/17/21 2:18 PM

## 2021-12-17 NOTE — MAU Note (Signed)
Bianca Mccarthy is a 26 y.o. at [redacted]w[redacted]d here in MAU reporting: being sent over from the office for headache, blurred vision, high blood pressure, and decreased fetal movement. Denies LOF or VB. BP at home 130's/70-80's took tylenol 500mg  tab at 0630 for headache and it did not relieve the headache. States the blurred vision comes and goes.   Onset of complaint: this morning  Pain score: 5 Vitals:   12/17/21 1209  BP: 115/63  Pulse: (!) 107  Resp: 18  Temp: 98.6 F (37 C)     FHT:145

## 2021-12-17 NOTE — Discharge Instructions (Signed)
For prevention of migraines in pregnancy: -Magnesium, 400mg  by mouth, once daily -Vitamin B2, 400mg  by mouth, once daily  For treatment of migraines in pregnancy: -take medication at the first sign of the pain of a headache, or the first sign of your aura -start with 1000mg  Tylenol (or excedrin tension headache with acetaminophen & caffeine only, NOT aspirin) with flexeril -Do not take more than 4000 mg of tylenol (acetaminophen) per day

## 2022-01-03 NOTE — L&D Delivery Note (Signed)
Delivery Note At 2:54 AM a viable female was delivered via Vaginal, Spontaneous (Presentation: Right Occiput Anterior).  APGAR: 8, 8; weight  pending .   Placenta status: Spontaneous, Intact.  Cord: 3 vessels with the following complications: None.  Cord pH: not obtained  Anesthesia: Epidural Episiotomy: None Lacerations: 2nd degree;Perineal Suture Repair: 3.0 chromic Est. Blood Loss (mL): 300  Mom to postpartum.  Baby to Couplet care / Skin to Skin.  Cyril Mourning 01/29/2022, 3:25 AM

## 2022-01-27 ENCOUNTER — Encounter (HOSPITAL_COMMUNITY): Payer: Self-pay | Admitting: Obstetrics and Gynecology

## 2022-01-27 ENCOUNTER — Inpatient Hospital Stay (HOSPITAL_COMMUNITY)
Admission: AD | Admit: 2022-01-27 | Discharge: 2022-01-31 | DRG: 807 | Disposition: A | Discharge status: Home / Self Care | Payer: Commercial Managed Care - PPO | Attending: Obstetrics and Gynecology | Admitting: Obstetrics and Gynecology

## 2022-01-27 DIAGNOSIS — O26893 Other specified pregnancy related conditions, third trimester: Secondary | ICD-10-CM | POA: Diagnosis present

## 2022-01-27 DIAGNOSIS — O1413 Severe pre-eclampsia, third trimester: Secondary | ICD-10-CM | POA: Diagnosis not present

## 2022-01-27 DIAGNOSIS — O1002 Pre-existing essential hypertension complicating childbirth: Secondary | ICD-10-CM | POA: Diagnosis present

## 2022-01-27 DIAGNOSIS — O114 Pre-existing hypertension with pre-eclampsia, complicating childbirth: Principal | ICD-10-CM | POA: Diagnosis present

## 2022-01-27 DIAGNOSIS — O24425 Gestational diabetes mellitus in childbirth, controlled by oral hypoglycemic drugs: Secondary | ICD-10-CM | POA: Diagnosis present

## 2022-01-27 DIAGNOSIS — Z3A35 35 weeks gestation of pregnancy: Secondary | ICD-10-CM | POA: Diagnosis not present

## 2022-01-27 DIAGNOSIS — R519 Headache, unspecified: Secondary | ICD-10-CM | POA: Diagnosis not present

## 2022-01-27 DIAGNOSIS — Z6791 Unspecified blood type, Rh negative: Secondary | ICD-10-CM

## 2022-01-27 DIAGNOSIS — O1490 Unspecified pre-eclampsia, unspecified trimester: Secondary | ICD-10-CM | POA: Diagnosis present

## 2022-01-27 DIAGNOSIS — Z23 Encounter for immunization: Secondary | ICD-10-CM

## 2022-01-27 LAB — CBC WITH DIFFERENTIAL/PLATELET
Abs Immature Granulocytes: 0.15 10*3/uL — ABNORMAL HIGH (ref 0.00–0.07)
Basophils Absolute: 0 10*3/uL (ref 0.0–0.1)
Basophils Relative: 0 %
Eosinophils Absolute: 0.1 10*3/uL (ref 0.0–0.5)
Eosinophils Relative: 1 %
HCT: 31.3 % — ABNORMAL LOW (ref 36.0–46.0)
Hemoglobin: 9.8 g/dL — ABNORMAL LOW (ref 12.0–15.0)
Immature Granulocytes: 2 %
Lymphocytes Relative: 19 %
Lymphs Abs: 1.8 10*3/uL (ref 0.7–4.0)
MCH: 24.3 pg — ABNORMAL LOW (ref 26.0–34.0)
MCHC: 31.3 g/dL (ref 30.0–36.0)
MCV: 77.7 fL — ABNORMAL LOW (ref 80.0–100.0)
Monocytes Absolute: 0.9 10*3/uL (ref 0.1–1.0)
Monocytes Relative: 9 %
Neutro Abs: 6.8 10*3/uL (ref 1.7–7.7)
Neutrophils Relative %: 69 %
Platelets: 211 10*3/uL (ref 150–400)
RBC: 4.03 MIL/uL (ref 3.87–5.11)
RDW: 14.6 % (ref 11.5–15.5)
WBC: 9.8 10*3/uL (ref 4.0–10.5)
nRBC: 0.2 % (ref 0.0–0.2)

## 2022-01-27 LAB — PROTEIN / CREATININE RATIO, URINE
Creatinine, Urine: 86 mg/dL
Protein Creatinine Ratio: 0.14 mg/mg{Cre} (ref 0.00–0.15)
Total Protein, Urine: 12 mg/dL

## 2022-01-27 MED ORDER — LABETALOL HCL 5 MG/ML IV SOLN: 80.0000 mg | INTRAVENOUS | Status: DC | PRN

## 2022-01-27 MED ORDER — DIPHENHYDRAMINE HCL 50 MG/ML IJ SOLN
12.5000 mg | Freq: Once | INTRAMUSCULAR | Status: AC
  Administered 2022-01-27: 12.5 mg via INTRAVENOUS
  Filled 2022-01-27: qty 1

## 2022-01-27 MED ORDER — HYDRALAZINE HCL 20 MG/ML IJ SOLN: 10.0000 mg | INTRAMUSCULAR | Status: DC | PRN

## 2022-01-27 MED ORDER — MAGNESIUM SULFATE BOLUS VIA INFUSION
4.0000 g | Freq: Once | INTRAVENOUS | Status: AC
  Administered 2022-01-28: 4 g via INTRAVENOUS
  Filled 2022-01-27: qty 1000

## 2022-01-27 MED ORDER — MAGNESIUM SULFATE 40 GM/1000ML IV SOLN
2.0000 g/h | INTRAVENOUS | Status: AC
  Administered 2022-01-28 – 2022-01-29 (×2): 2 g/h via INTRAVENOUS
  Filled 2022-01-27 (×3): qty 1000

## 2022-01-27 MED ORDER — LABETALOL HCL 5 MG/ML IV SOLN: 40.0000 mg | INTRAVENOUS | Status: DC | PRN

## 2022-01-27 MED ORDER — LABETALOL HCL 5 MG/ML IV SOLN: 20.0000 mg | INTRAVENOUS | Status: DC | PRN

## 2022-01-27 MED ORDER — LACTATED RINGERS IV SOLN: INTRAVENOUS | Status: DC

## 2022-01-27 MED ORDER — METOCLOPRAMIDE HCL 5 MG/ML IJ SOLN
10.0000 mg | Freq: Once | INTRAMUSCULAR | Status: AC
  Administered 2022-01-27: 10 mg via INTRAVENOUS
  Filled 2022-01-27: qty 2

## 2022-01-27 NOTE — H&P (Signed)
OB History and Physical   Bianca Mccarthy is a 27 y.o. female G1 presenting for headache and blurred vision not responsive to medications.  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  She has noted increasing BP at home and was seen in the office today and increased from labetalol 200 mg TID to 400 TID.  Throughout the evening, she has had worsening headache that awoke her from sleep and vision changes not relieved with tylenol or headache cocktail in MAU.  She reports good FM, denies LOF, VB, ctx.  Rh negative (Rhogam given 08/10/21), GBS unknown, panorama low risk.  Last [place] 01/05/22 with EFW at 84%ile.    OB History     Gravida  1   Para      Term      Preterm      AB      Living         SAB      IAB      Ectopic      Multiple      Live Births             Past Medical History:  Diagnosis Date   Chronic hypertension    Past Surgical History:  Procedure Laterality Date   NO PAST SURGERIES     Family History: family history includes Hypertension in her maternal grandmother and mother. Social History:  reports that she has never smoked. She has never used smokeless tobacco. She reports current alcohol use. She reports that she does not use drugs.     Maternal Diabetes: A2GDM Genetic Screening: Normal Maternal Ultrasounds/Referrals: Normal Fetal Ultrasounds or other Referrals:  None Maternal Substance Abuse:  No Significant Maternal Medications:  Labetalol and glyburide Significant Maternal Lab Results:  Rh negative Other Comments:  None  Review of Systems - Patient denies fever, chills, SOB, CP, N/V/D.  History   Blood pressure (!) 148/91, pulse (!) 125, temperature 97.9 F (36.6 C), temperature source Oral, resp. rate 18, height 5\' 4"  (1.626 m), weight 103.9 kg, last menstrual period 05/26/2021, SpO2 100 %. Exam Physical Exam   Gen: alert, well appearing, no distress Chest: nonlabored  breathing CV: no peripheral edema Abdomen: soft, gravid  Ext: no evidence of DVT  Prenatal labs: ABO, Rh:   Antibody:   Rubella:   RPR:    HBsAg:    HIV:    GBS:     Assessment/Plan: Admit to Labor and Delivery cHTN with superimposed preeclampsia with severe features (headache and vision changes) not responsive to medications. 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

## 2022-01-27 NOTE — MAU Note (Signed)
.  Bianca Mccarthy is a 27 y.o. at [redacted]w[redacted]d here in MAU reporting: has CHTN and was seen in the office earlier today and her blood pressure was elevated, so they upped her Labetalol dose to 200 mg 3x/day. She came in because she said her BP was still elevated tonight (148/88) and she has a HA (5/10) since last night that has not responded to Tylenol. Also started seeing blurry vision 2 hours ago. She reports an increase in swelling in her BLE. Denies and epigastric/RUQ pain. Denies VB or LOF. Reports good FM.  LMP: N/A Onset of complaint: Last night Pain score: 5/10 Vitals:   01/27/22 2223 01/27/22 2233  BP: (!) 140/90 (!) 138/92  Pulse: (!) 113 (!) 120  Resp: 18   Temp: 97.9 F (36.6 C)   SpO2: 100%      FHT:165 Lab orders placed from triage:  UA

## 2022-01-27 NOTE — MAU Provider Note (Signed)
History     CSN: [id number]  Arrival date and time: 01/27/22 2205   None     Chief Complaint  Patient presents with   Hypertension   Headache   HPI  [name] is a 27 y.o. female G1P0 @ [redacted]w[redacted]d with a history of chronic hypertesion on labetalol. She was seen in the office today and BP's were elevated (148/88), she reports 30 of protein in her urine. Her labetalol was increased to 200 mg TID.   She Reports new onset of vision changes in the last hour. She reports blurred vision in both eyes. She reports this happening at the time of CHTN diagnoses @ 18 weeks but not in both eyes.  She reports HA which started last night and persisted all day today. She has taken 1000 mg ot tylenol x 3 times in the last 24 hours. . The tylenol has not changed her HA. She reports HA behind both of her eyes, and she currently rates that about a 5.   She is currently taking BASA daily.   OB History     Gravida  1   Para      Term      Preterm      AB      Living         SAB      IAB      Ectopic      Multiple      Live Births              Past Medical History:  Diagnosis Date   Chronic hypertension     Past Surgical History:  Procedure Laterality Date   NO PAST SURGERIES      Family History  Problem Relation Age of Onset   Hypertension Mother    Hypertension Maternal Grandmother     Social History   Tobacco Use   Smoking status: Never   Smokeless tobacco: Never  Substance Use Topics   Alcohol use: Yes   Drug use: No    Allergies: No Known Allergies  Medications Prior to Admission  Medication Sig Dispense Refill Last Dose   acetaminophen (TYLENOL) 500 MG tablet Take 1,000 mg by mouth every 6 (six) hours as needed.   01/27/2022 at 1900   cyclobenzaprine (FLEXERIL) 10 MG tablet Take 1 tablet (10 mg total) by mouth 3 (three) times daily as needed (headache). 30 tablet 0 Past Month   glyBURIDE (DIABETA) 2.5 MG tablet Take 2.5 mg by mouth daily  with breakfast.   01/27/2022   labetalol (NORMODYNE) 200 MG tablet Take 200 mg by mouth 3 (three) times daily.   01/27/2022 at 2030   Prenatal Vit-Fe Fumarate-FA (PRENATAL VITAMIN PO) Take 1 tablet by mouth daily.   01/27/2022   promethazine (PHENERGAN) 12.5 MG tablet Take 1 tablet (12.5 mg total) by mouth every 6 (six) hours as needed for nausea or vomiting. 10 tablet 0    Results for orders placed or performed during the hospital encounter of 01/27/22 (from the past 48 hour(s))  Protein / creatinine ratio, urine     Status: None   Collection Time: 01/27/22 10:29 PM  Result Value Ref Range   Creatinine, Urine 86 mg/dL   Total Protein, Urine 12 mg/dL    Comment: NO NORMAL RANGE ESTABLISHED FOR THIS TEST   Protein Creatinine Ratio 0.14 0.00 - 0.15 mg/mg[Cre]    Comment: Performed at [facility], [address] [place], [place] [address]  CBC with Differential/Platelet     Status: Abnormal   Collection Time: 01/27/22 11:10 PM  Result Value Ref Range   WBC 9.8 4.0 - 10.5 K/uL   RBC 4.03 3.87 - 5.11 MIL/uL   Hemoglobin 9.8 (L) 12.0 - 15.0 g/dL   HCT 31.3 (L) 36.0 - 46.0 %   MCV 77.7 (L) 80.0 - 100.0 fL   MCH 24.3 (L) 26.0 - 34.0 pg   MCHC 31.3 30.0 - 36.0 g/dL   RDW 14.6 11.5 - 15.5 %   Platelets 211 150 - 400 K/uL   nRBC 0.2 0.0 - 0.2 %   Neutrophils Relative % 69 %   Neutro Abs 6.8 1.7 - 7.7 K/uL   Lymphocytes Relative 19 %   Lymphs Abs 1.8 0.7 - 4.0 K/uL   Monocytes Relative 9 %   Monocytes Absolute 0.9 0.1 - 1.0 K/uL   Eosinophils Relative 1 %   Eosinophils Absolute 0.1 0.0 - 0.5 K/uL   Basophils Relative 0 %   Basophils Absolute 0.0 0.0 - 0.1 K/uL   Immature Granulocytes 2 %   Abs Immature Granulocytes 0.15 (H) 0.00 - 0.07 K/uL    Comment: Performed at [facility], [address] [place], [place] [address]    Review of Systems  Eyes:  Positive for visual disturbance. Negative for photophobia.  Neurological:  Positive for headaches.   Physical Exam   Blood  pressure (!) 138/92, pulse (!) 120, temperature 97.9 F (36.6 C), temperature source Oral, resp. rate 18, height 5\' 4"  (1.626 m), weight 103.9 kg, last menstrual period 05/26/2021, SpO2 100 %.  Patient Vitals for the past 24 hrs:  BP Temp Temp src Pulse Resp SpO2 Height Weight  01/27/22 2301 (!) 148/91 -- -- (!) 125 -- 100 % -- --  01/27/22 2246 (!) 143/91 -- -- (!) 123 -- 100 % -- --  01/27/22 2233 (!) 138/92 -- -- (!) 120 -- -- -- --  01/27/22 2223 (!) 140/90 97.9 F (36.6 C) Oral (!) 113 18 100 % 5\' 4"  (1.626 m) 103.9 kg     Physical Exam Vitals and nursing note reviewed.  Constitutional:      General: She is not in acute distress.    Appearance: She is well-developed. She is not ill-appearing, toxic-appearing or diaphoretic.  Pulmonary:     Effort: Pulmonary effort is normal.  Musculoskeletal:        General: Normal range of motion.  Neurological:     Mental Status: She is alert and oriented to person, place, and time.     GCS: GCS eye subscore is 4. GCS verbal subscore is 5. GCS motor subscore is 6.     Deep Tendon Reflexes: Reflexes normal (Negative clonus).  Psychiatric:        Mood and Affect: Mood normal.    Fetal Tracing: Baseline: 140 bpm Variability: Moderate  Accelerations: 15x15 Decelerations: None Toco: None  MAU Course  Procedures  MDM  Discussed patient with Dr. Mardelle Matte at 2250: Discussed HA, new onset changes in her vision. Labs and PCR have been ordered and are pending. Discussed treating HA with HA cocktail. Discussed magnesium and will wait to start once labs and HA cocktail are complete.   Dr. Mardelle Matte down to MAU to speak with the patient.  Vertex position confirmed via [place] by Caryl Pina RN   Assessment and Plan   A:  1. Severe pre-eclampsia in third trimester   2. [redacted] weeks gestation of pregnancy  P:  Admit to labor Magnesium planned Patient agreeable with plan of care.  Noni Saupe I, NP 01/28/2022 12:00 AM

## 2022-01-28 ENCOUNTER — Inpatient Hospital Stay (HOSPITAL_BASED_OUTPATIENT_CLINIC_OR_DEPARTMENT_OTHER): Payer: Self-pay | Admitting: Anesthesiology

## 2022-01-28 ENCOUNTER — Encounter (HOSPITAL_COMMUNITY): Payer: Self-pay | Admitting: Obstetrics and Gynecology

## 2022-01-28 ENCOUNTER — Encounter (HOSPITAL_BASED_OUTPATIENT_CLINIC_OR_DEPARTMENT_OTHER): Payer: Self-pay | Admitting: Anesthesiology

## 2022-01-28 DIAGNOSIS — Z6791 Unspecified blood type, Rh negative: Secondary | ICD-10-CM | POA: Diagnosis not present

## 2022-01-28 DIAGNOSIS — O24425 Gestational diabetes mellitus in childbirth, controlled by oral hypoglycemic drugs: Secondary | ICD-10-CM | POA: Diagnosis present

## 2022-01-28 DIAGNOSIS — O1002 Pre-existing essential hypertension complicating childbirth: Secondary | ICD-10-CM | POA: Diagnosis present

## 2022-01-28 DIAGNOSIS — O1413 Severe pre-eclampsia, third trimester: Secondary | ICD-10-CM | POA: Diagnosis not present

## 2022-01-28 DIAGNOSIS — Z23 Encounter for immunization: Secondary | ICD-10-CM | POA: Diagnosis not present

## 2022-01-28 DIAGNOSIS — Z3A35 35 weeks gestation of pregnancy: Secondary | ICD-10-CM | POA: Diagnosis not present

## 2022-01-28 DIAGNOSIS — O26893 Other specified pregnancy related conditions, third trimester: Secondary | ICD-10-CM | POA: Diagnosis present

## 2022-01-28 DIAGNOSIS — O114 Pre-existing hypertension with pre-eclampsia, complicating childbirth: Secondary | ICD-10-CM | POA: Diagnosis present

## 2022-01-28 DIAGNOSIS — R519 Headache, unspecified: Secondary | ICD-10-CM | POA: Diagnosis present

## 2022-01-28 DIAGNOSIS — O1490 Unspecified pre-eclampsia, unspecified trimester: Secondary | ICD-10-CM | POA: Diagnosis present

## 2022-01-28 LAB — COMPREHENSIVE METABOLIC PANEL
ALT: 11 U/L (ref 0–44)
AST: 19 U/L (ref 15–41)
Albumin: 2.7 g/dL — ABNORMAL LOW (ref 3.5–5.0)
Alkaline Phosphatase: 136 U/L — ABNORMAL HIGH (ref 38–126)
Anion gap: 8 (ref 5–15)
BUN: 5 mg/dL — ABNORMAL LOW (ref 6–20)
CO2: 23 mmol/L (ref 22–32)
Calcium: 8.9 mg/dL (ref 8.9–10.3)
Chloride: 105 mmol/L (ref 98–111)
Creatinine, Ser: 0.69 mg/dL (ref 0.44–1.00)
GFR, Estimated: >60 mL/min (ref >60)
Glucose, Bld: 111 mg/dL — ABNORMAL HIGH (ref 70–99)
Potassium: 4.2 mmol/L (ref 3.5–5.1)
Sodium: 136 mmol/L (ref 135–145)
Total Bilirubin: 0.3 mg/dL (ref 0.3–1.2)
Total Protein: 5.4 g/dL — ABNORMAL LOW (ref 6.5–8.1)

## 2022-01-28 LAB — TYPE AND SCREEN
ABO/RH(D): O NEG
Antibody Screen: POSITIVE

## 2022-01-28 LAB — CBC
HCT: 30.2 % — ABNORMAL LOW (ref 36.0–46.0)
Hemoglobin: 9.6 g/dL — ABNORMAL LOW (ref 12.0–15.0)
MCH: 24.7 pg — ABNORMAL LOW (ref 26.0–34.0)
MCHC: 31.8 g/dL (ref 30.0–36.0)
MCV: 77.8 fL — ABNORMAL LOW (ref 80.0–100.0)
Platelets: 209 10*3/uL (ref 150–400)
RBC: 3.88 MIL/uL (ref 3.87–5.11)
RDW: 14.9 % (ref 11.5–15.5)
WBC: 9.6 10*3/uL (ref 4.0–10.5)
nRBC: 0 % (ref 0.0–0.2)

## 2022-01-28 LAB — GLUCOSE, CAPILLARY
Glucose-Capillary: 114 mg/dL — ABNORMAL HIGH (ref 70–99)
Glucose-Capillary: 94 mg/dL (ref 70–99)
Glucose-Capillary: 95 mg/dL (ref 70–99)
Glucose-Capillary: 91 mg/dL (ref 70–99)
Glucose-Capillary: 101 mg/dL — ABNORMAL HIGH (ref 70–99)
Glucose-Capillary: 115 mg/dL — ABNORMAL HIGH (ref 70–99)

## 2022-01-28 MED ORDER — LACTATED RINGERS IV SOLN
500.0000 mL | INTRAVENOUS | Status: DC | PRN
  Administered 2022-01-28: 500 mL via INTRAVENOUS

## 2022-01-28 MED ORDER — PHENYLEPHRINE 80 MCG/ML (10ML) SYRINGE FOR IV PUSH (FOR BLOOD PRESSURE SUPPORT): 80.0000 ug | PREFILLED_SYRINGE | INTRAVENOUS | Status: DC | PRN

## 2022-01-28 MED ORDER — ACETAMINOPHEN 325 MG PO TABS
650.0000 mg | ORAL_TABLET | ORAL | Status: DC | PRN
  Administered 2022-01-28: 650 mg via ORAL
  Filled 2022-01-28: qty 2

## 2022-01-28 MED ORDER — PHENYLEPHRINE 80 MCG/ML (10ML) SYRINGE FOR IV PUSH (FOR BLOOD PRESSURE SUPPORT)
80.0000 ug | PREFILLED_SYRINGE | INTRAVENOUS | Status: DC | PRN
  Filled 2022-01-28: qty 10

## 2022-01-28 MED ORDER — ONDANSETRON HCL 4 MG/2ML IJ SOLN
4.0000 mg | Freq: Four times a day (QID) | INTRAMUSCULAR | Status: DC | PRN
  Administered 2022-01-29: 4 mg via INTRAVENOUS
  Filled 2022-01-28: qty 2

## 2022-01-28 MED ORDER — OXYCODONE-ACETAMINOPHEN 5-325 MG PO TABS: 1.0000 | ORAL_TABLET | ORAL | Status: DC | PRN

## 2022-01-28 MED ORDER — BUTALBITAL-APAP-CAFFEINE 50-325-40 MG PO TABS
1.0000 | ORAL_TABLET | Freq: Four times a day (QID) | ORAL | Status: DC | PRN
  Administered 2022-01-28: 1 via ORAL
  Filled 2022-01-28: qty 1

## 2022-01-28 MED ORDER — TERBUTALINE SULFATE 1 MG/ML IJ SOLN: 0.2500 mg | Freq: Once | INTRAMUSCULAR | Status: DC | PRN

## 2022-01-28 MED ORDER — HYDROXYZINE HCL 50 MG PO TABS: 50.0000 mg | ORAL_TABLET | Freq: Four times a day (QID) | ORAL | Status: DC | PRN

## 2022-01-28 MED ORDER — SOD CITRATE-CITRIC ACID 500-334 MG/5ML PO SOLN: 30.0000 mL | ORAL | Status: DC | PRN

## 2022-01-28 MED ORDER — DIPHENHYDRAMINE HCL 50 MG/ML IJ SOLN: 12.5000 mg | INTRAMUSCULAR | Status: DC | PRN

## 2022-01-28 MED ORDER — LIDOCAINE HCL (PF) 1 % IJ SOLN: 30.0000 mL | INTRAMUSCULAR | Status: DC | PRN

## 2022-01-28 MED ORDER — OXYTOCIN BOLUS FROM INFUSION
333.0000 mL | Freq: Once | INTRAVENOUS | Status: AC
  Administered 2022-01-29: 333 mL via INTRAVENOUS

## 2022-01-28 MED ORDER — OXYTOCIN-SODIUM CHLORIDE 30-0.9 UT/500ML-% IV SOLN
2.5000 [IU]/h | INTRAVENOUS | Status: DC
  Filled 2022-01-28: qty 500

## 2022-01-28 MED ORDER — LACTATED RINGERS IV SOLN
500.0000 mL | Freq: Once | INTRAVENOUS | Status: AC
  Administered 2022-01-28: 500 mL via INTRAVENOUS

## 2022-01-28 MED ORDER — LACTATED RINGERS IV SOLN: INTRAVENOUS | Status: DC

## 2022-01-28 MED ORDER — FENTANYL-BUPIVACAINE-NACL 0.5-0.125-0.9 MG/250ML-% EP SOLN
12.0000 mL/h | EPIDURAL | Status: DC | PRN
  Administered 2022-01-28: 12 mL/h via EPIDURAL
  Filled 2022-01-28: qty 250

## 2022-01-28 MED ORDER — OXYCODONE-ACETAMINOPHEN 5-325 MG PO TABS: 2.0000 | ORAL_TABLET | ORAL | Status: DC | PRN

## 2022-01-28 MED ORDER — OXYTOCIN-SODIUM CHLORIDE 30-0.9 UT/500ML-% IV SOLN
1.0000 m[IU]/min | INTRAVENOUS | Status: DC
  Administered 2022-01-28: 2 m[IU]/min via INTRAVENOUS
  Filled 2022-01-28: qty 500

## 2022-01-28 MED ORDER — MISOPROSTOL 25 MCG QUARTER TABLET
25.0000 ug | ORAL_TABLET | ORAL | Status: DC | PRN
  Administered 2022-01-28 (×2): 25 ug via VAGINAL
  Filled 2022-01-28 (×2): qty 1

## 2022-01-28 MED ORDER — LIDOCAINE-EPINEPHRINE (PF) 2 %-1:200000 IJ SOLN
INTRAMUSCULAR | Status: DC | PRN
  Administered 2022-01-28: 3 mL via EPIDURAL

## 2022-01-28 MED ORDER — LABETALOL HCL 200 MG PO TABS
400.0000 mg | ORAL_TABLET | Freq: Three times a day (TID) | ORAL | Status: DC
  Administered 2022-01-28: 400 mg via ORAL
  Filled 2022-01-28: qty 4

## 2022-01-28 MED ORDER — PENICILLIN G POT IN DEXTROSE 60000 UNIT/ML IV SOLN
3.0000 10*6.[IU] | INTRAVENOUS | Status: DC
  Administered 2022-01-28 (×4): 3 10*6.[IU] via INTRAVENOUS
  Filled 2022-01-28 (×7): qty 50

## 2022-01-28 MED ORDER — SODIUM CHLORIDE 0.9 % IV SOLN
5.0000 10*6.[IU] | Freq: Once | INTRAVENOUS | Status: AC
  Administered 2022-01-28: 5 10*6.[IU] via INTRAVENOUS
  Filled 2022-01-28: qty 5

## 2022-01-28 MED ORDER — EPHEDRINE 5 MG/ML INJ: 10.0000 mg | INTRAVENOUS | Status: DC | PRN

## 2022-01-28 MED ORDER — EPHEDRINE 5 MG/ML INJ
10.0000 mg | INTRAVENOUS | Status: DC | PRN
  Filled 2022-01-28: qty 5

## 2022-01-28 NOTE — Progress Notes (Signed)
Patient is status post cytotec x 2. Still reporting headache. BP (!) 142/82   Pulse (!) 104   Temp 98.5 F (36.9 C) (Oral)   Resp 18   Ht 5\' 4"  (1.626 m)   Wt 103.9 kg   LMP 05/26/2021   SpO2 99%   BMI 39.31 kg/m  Results for orders placed or performed during the hospital encounter of 01/27/22 (from the past 24 hour(s))  Protein / creatinine ratio, urine     Status: None   Collection Time: 01/27/22 10:29 PM  Result Value Ref Range   Creatinine, Urine 86 mg/dL   Total Protein, Urine 12 mg/dL   Protein Creatinine Ratio 0.14 0.00 - 0.15 mg/mg[Cre]  Type and screen [place]     Status: None   Collection Time: 01/27/22 11:06 PM  Result Value Ref Range   ABO/RH(D) O NEG    Antibody Screen POS    Sample Expiration 01/30/2022,2359    Antibody Identification      PASSIVELY ACQUIRED ANTI-D Performed at [facility], [place] [address], [place], [place] [address]   CBC with Differential/Platelet     Status: Abnormal   Collection Time: 01/27/22 11:10 PM  Result Value Ref Range   WBC 9.8 4.0 - 10.5 K/uL   RBC 4.03 3.87 - 5.11 MIL/uL   Hemoglobin 9.8 (L) 12.0 - 15.0 g/dL   HCT 31.3 (L) 36.0 - 46.0 %   MCV 77.7 (L) 80.0 - 100.0 fL   MCH 24.3 (L) 26.0 - 34.0 pg   MCHC 31.3 30.0 - 36.0 g/dL   RDW 14.6 11.5 - 15.5 %   Platelets 211 150 - 400 K/uL   nRBC 0.2 0.0 - 0.2 %   Neutrophils Relative % 69 %   Neutro Abs 6.8 1.7 - 7.7 K/uL   Lymphocytes Relative 19 %   Lymphs Abs 1.8 0.7 - 4.0 K/uL   Monocytes Relative 9 %   Monocytes Absolute 0.9 0.1 - 1.0 K/uL   Eosinophils Relative 1 %   Eosinophils Absolute 0.1 0.0 - 0.5 K/uL   Basophils Relative 0 %   Basophils Absolute 0.0 0.0 - 0.1 K/uL   Immature Granulocytes 2 %   Abs Immature Granulocytes 0.15 (H) 0.00 - 0.07 K/uL  Comprehensive metabolic panel     Status: Abnormal   Collection Time: 01/27/22 11:10 PM  Result Value Ref Range   Sodium 136 135 - 145 mmol/L   Potassium 4.2 3.5 - 5.1 mmol/L   Chloride 105 98  - 111 mmol/L   CO2 23 22 - 32 mmol/L   Glucose, Bld 111 (H) 70 - 99 mg/dL   BUN 5 (L) 6 - 20 mg/dL   Creatinine, Ser 0.69 0.44 - 1.00 mg/dL   Calcium 8.9 8.9 - 10.3 mg/dL   Total Protein 5.4 (L) 6.5 - 8.1 g/dL   Albumin 2.7 (L) 3.5 - 5.0 g/dL   AST 19 15 - 41 U/L   ALT 11 0 - 44 U/L   Alkaline Phosphatase 136 (H) 38 - 126 U/L   Total Bilirubin 0.3 0.3 - 1.2 mg/dL   GFR, Estimated >60 >60 mL/min   Anion gap 8 5 - 15  Glucose, capillary     Status: Abnormal   Collection Time: 01/28/22  1:02 AM  Result Value Ref Range   Glucose-Capillary 114 (H) 70 - 99 mg/dL  Glucose, capillary     Status: None   Collection Time: 01/28/22  5:21 AM  Result Value  Ref Range   Glucose-Capillary 94 70 - 99 mg/dL   FHR category 1 Cervix is 50% 2.5 -2 AROM Clear fluid  IMPRESSION: IUP at 35 w 2 days IOL secondary to possible chronic htn with superimposed preeclampsia (Severe)  A 2 GDM  Epidural Pitocin  Follow labor curve Magnesium And will give Magnesium 24 hours post partum Continue labetalol

## 2022-01-28 NOTE — Progress Notes (Signed)
FHR reactive  Cervix is 90% 4 to 5 cm -1  Vertex Epidural

## 2022-01-28 NOTE — Anesthesia Procedure Notes (Signed)
Epidural Patient location during procedure: OB Start time: 01/28/2022 5:31 PM End time: 01/28/2022 5:51 PM  Staffing Anesthesiologist: Oleta Mouse, MD Performed: anesthesiologist   Preanesthetic Checklist Completed: patient identified, IV checked, risks and benefits discussed, monitors and equipment checked, pre-op evaluation and timeout performed  Epidural Patient position: sitting Prep: DuraPrep Patient monitoring: heart rate, continuous pulse ox and blood pressure Approach: midline Location: L4-L5 Injection technique: LOR saline  Needle:  Needle type: Tuohy  Needle gauge: 17 G Needle length: 9 cm Needle insertion depth: 8 cm Catheter type: closed end flexible Catheter size: 19 Gauge Catheter at skin depth: 13 cm Test dose: negative and 2% lidocaine with Epi 1:200 K  Assessment Events: blood not aspirated, no cerebrospinal fluid, injection not painful, no injection resistance, no paresthesia and negative IV test

## 2022-01-28 NOTE — Anesthesia Preprocedure Evaluation (Signed)
Anesthesia Evaluation  Patient identified by MRN, date of birth, ID band Patient awake    Reviewed: Allergy & Precautions, Patient's Chart, lab work & pertinent test results  History of Anesthesia Complications Negative for: history of anesthetic complications  Airway Mallampati: II  TM Distance: >3 FB Neck ROM: Full    Dental  (+) Dental Advisory Given   Pulmonary neg pulmonary ROS   breath sounds clear to auscultation       Cardiovascular hypertension,  Rhythm:Regular  gHTN on MgSO4 infusion, was on labetalol 400mg  tid   Neuro/Psych negative neurological ROS  negative psych ROS   GI/Hepatic negative GI ROS, Neg liver ROS,,,  Endo/Other  diabetes, Gestational    Renal/GU negative Renal ROS     Musculoskeletal   Abdominal   Peds  Hematology negative hematology ROS (+) Lab Results      Component                Value               Date                      WBC                      9.6                 01/28/2022                HGB                      9.6 (L)             01/28/2022                HCT                      30.2 (L)            01/28/2022                MCV                      77.8 (L)            01/28/2022                PLT                      209                 01/28/2022              Anesthesia Other Findings   Reproductive/Obstetrics (+) Pregnancy                              Anesthesia Physical Anesthesia Plan  ASA: 3  Anesthesia Plan: Epidural   Post-op Pain Management:    Induction:   PONV Risk Score and Plan: 2 and Treatment may vary due to age or medical condition  Airway Management Planned: Natural Airway  Additional Equipment: None  Intra-op Plan:   Post-operative Plan:   Informed Consent: I have reviewed the patients History and Physical, chart, labs and discussed the procedure including the risks, benefits and alternatives for the proposed  anesthesia with the patient or authorized representative who has indicated his/her understanding and acceptance.  Plan Discussed with:   Anesthesia Plan Comments:          Anesthesia Quick Evaluation

## 2022-01-29 ENCOUNTER — Encounter (HOSPITAL_COMMUNITY): Payer: Self-pay | Admitting: Obstetrics and Gynecology

## 2022-01-29 LAB — COMPREHENSIVE METABOLIC PANEL
ALT: 13 U/L (ref 0–44)
AST: 27 U/L (ref 15–41)
Albumin: 2.5 g/dL — ABNORMAL LOW (ref 3.5–5.0)
Alkaline Phosphatase: 130 U/L — ABNORMAL HIGH (ref 38–126)
Anion gap: 9 (ref 5–15)
BUN: <5 mg/dL — ABNORMAL LOW (ref 6–20)
CO2: 21 mmol/L — ABNORMAL LOW (ref 22–32)
Calcium: 7.8 mg/dL — ABNORMAL LOW (ref 8.9–10.3)
Chloride: 103 mmol/L (ref 98–111)
Creatinine, Ser: 0.76 mg/dL (ref 0.44–1.00)
GFR, Estimated: >60 mL/min (ref >60)
Glucose, Bld: 126 mg/dL — ABNORMAL HIGH (ref 70–99)
Potassium: 4 mmol/L (ref 3.5–5.1)
Sodium: 133 mmol/L — ABNORMAL LOW (ref 135–145)
Total Bilirubin: 0.8 mg/dL (ref 0.3–1.2)
Total Protein: 5.2 g/dL — ABNORMAL LOW (ref 6.5–8.1)

## 2022-01-29 LAB — CBC
HCT: 29.8 % — ABNORMAL LOW (ref 36.0–46.0)
Hemoglobin: 9.8 g/dL — ABNORMAL LOW (ref 12.0–15.0)
MCH: 25.1 pg — ABNORMAL LOW (ref 26.0–34.0)
MCHC: 32.9 g/dL (ref 30.0–36.0)
MCV: 76.4 fL — ABNORMAL LOW (ref 80.0–100.0)
Platelets: 231 10*3/uL (ref 150–400)
RBC: 3.9 MIL/uL (ref 3.87–5.11)
RDW: 14.9 % (ref 11.5–15.5)
WBC: 17.4 10*3/uL — ABNORMAL HIGH (ref 4.0–10.5)
nRBC: 0 % (ref 0.0–0.2)

## 2022-01-29 MED ORDER — ONDANSETRON HCL 4 MG PO TABS: 4.0000 mg | ORAL_TABLET | ORAL | Status: DC | PRN

## 2022-01-29 MED ORDER — TETANUS-DIPHTH-ACELL PERTUSSIS 5-2.5-18.5 LF-MCG/0.5 IM SUSY: 0.5000 mL | PREFILLED_SYRINGE | Freq: Once | INTRAMUSCULAR | Status: DC

## 2022-01-29 MED ORDER — OXYCODONE HCL 5 MG PO TABS
5.0000 mg | ORAL_TABLET | ORAL | Status: DC | PRN
  Administered 2022-01-29: 5 mg via ORAL
  Filled 2022-01-29: qty 1

## 2022-01-29 MED ORDER — BISACODYL 10 MG RE SUPP: 10.0000 mg | Freq: Every day | RECTAL | Status: DC | PRN

## 2022-01-29 MED ORDER — OXYCODONE HCL 5 MG PO TABS
10.0000 mg | ORAL_TABLET | ORAL | Status: DC | PRN
  Administered 2022-01-29: 10 mg via ORAL
  Filled 2022-01-29: qty 2

## 2022-01-29 MED ORDER — LACTATED RINGERS IV SOLN: INTRAVENOUS | Status: DC

## 2022-01-29 MED ORDER — SIMETHICONE 80 MG PO CHEW: 80.0000 mg | CHEWABLE_TABLET | ORAL | Status: DC | PRN

## 2022-01-29 MED ORDER — MEDROXYPROGESTERONE ACETATE 150 MG/ML IM SUSP: 150.0000 mg | INTRAMUSCULAR | Status: DC | PRN

## 2022-01-29 MED ORDER — ZOLPIDEM TARTRATE 5 MG PO TABS: 5.0000 mg | ORAL_TABLET | Freq: Every evening | ORAL | Status: DC | PRN

## 2022-01-29 MED ORDER — IBUPROFEN 600 MG PO TABS
600.0000 mg | ORAL_TABLET | Freq: Four times a day (QID) | ORAL | Status: DC
  Administered 2022-01-29 – 2022-01-31 (×10): 600 mg via ORAL
  Filled 2022-01-29 (×10): qty 1

## 2022-01-29 MED ORDER — FLEET ENEMA 7-19 GM/118ML RE ENEM: 1.0000 | ENEMA | Freq: Every day | RECTAL | Status: DC | PRN

## 2022-01-29 MED ORDER — ACETAMINOPHEN 325 MG PO TABS
650.0000 mg | ORAL_TABLET | ORAL | Status: DC | PRN
  Administered 2022-01-29 – 2022-01-30 (×5): 650 mg via ORAL
  Filled 2022-01-29 (×5): qty 2

## 2022-01-29 MED ORDER — SENNOSIDES-DOCUSATE SODIUM 8.6-50 MG PO TABS
2.0000 | ORAL_TABLET | Freq: Every day | ORAL | Status: DC
  Administered 2022-01-30 – 2022-01-31 (×2): 2 via ORAL
  Filled 2022-01-29 (×2): qty 2

## 2022-01-29 MED ORDER — BENZOCAINE-MENTHOL 20-0.5 % EX AERO
1.0000 | INHALATION_SPRAY | CUTANEOUS | Status: DC | PRN
  Administered 2022-01-30: 1 via TOPICAL
  Filled 2022-01-29 (×2): qty 56

## 2022-01-29 MED ORDER — ONDANSETRON HCL 4 MG/2ML IJ SOLN: 4.0000 mg | INTRAMUSCULAR | Status: DC | PRN

## 2022-01-29 MED ORDER — DIBUCAINE (PERIANAL) 1 % EX OINT: 1.0000 | TOPICAL_OINTMENT | CUTANEOUS | Status: DC | PRN

## 2022-01-29 MED ORDER — DIPHENHYDRAMINE HCL 25 MG PO CAPS: 25.0000 mg | ORAL_CAPSULE | Freq: Four times a day (QID) | ORAL | Status: DC | PRN

## 2022-01-29 MED ORDER — WITCH HAZEL-GLYCERIN EX PADS: 1.0000 | MEDICATED_PAD | CUTANEOUS | Status: DC | PRN

## 2022-01-29 MED ORDER — COCONUT OIL OIL
1.0000 | TOPICAL_OIL | Status: DC | PRN
  Administered 2022-01-30: 1 via TOPICAL

## 2022-01-29 MED ORDER — PRENATAL MULTIVITAMIN CH
1.0000 | ORAL_TABLET | Freq: Every day | ORAL | Status: DC
  Administered 2022-01-29 – 2022-01-31 (×3): 1 via ORAL
  Filled 2022-01-29 (×3): qty 1

## 2022-01-29 MED ORDER — MEASLES, MUMPS & RUBELLA VAC IJ SOLR
0.5000 mL | Freq: Once | INTRAMUSCULAR | Status: AC
  Administered 2022-01-30: 0.5 mL via SUBCUTANEOUS
  Filled 2022-01-29: qty 0.5

## 2022-01-29 NOTE — Lactation Note (Signed)
This note was copied from a baby's chart. Lactation Consultation Note  Patient Name: Girl Hazyl Marseille [id number] Date: 01/29/2022 Reason for consult: Initial assessment;1st time breastfeeding;Primapara;Late-preterm 34-36.6wks;Maternal endocrine disorder;Other (Comment);Mother's request (GHTN) Age:26 hours  LC in to visit with P1 Mom of LPTI delivered vaginally.  Baby has not fed on breast of by bottle yet.  2 CBGs were 41 and 65.  GMOB holding baby triple swaddled.  LC encouraged STS on Mom's chest.  Placed baby there to demonstrate.    Reviewed breast massage and hand expression.  Positioned baby at the breast in football hold but baby fussing and didn't open her mouth to latch.  Reassured Mom.  [place] set up DEBP and assisted with first pumping using 21 mm flanges.  Mom aware of importance of consistent pumping and STS.  Mom is choosing donor breast milk to supplement.  Guidelines reviewed of importance of consistent supplementation if baby is not feeding well at the breast.  Plan- 1- Keep baby STS as much as possible 2- Offer the breast often with cues, asking for help prn 3-supplement baby according to LPTI guidelines by paced bottle 4-Pump both breasts on initiation setting  Mom to ask for help prn  Maternal Data Has patient been taught Hand Expression?: Yes Does the patient have breastfeeding experience prior to this delivery?: No  Feeding Mother's Current Feeding Choice: Breast Milk and Donor Milk  LATCH Score Latch: Too sleepy or reluctant, no latch achieved, no sucking elicited.  Audible Swallowing: None  Type of Nipple: Everted at rest and after stimulation  Comfort (Breast/Nipple): Soft / non-tender  Hold (Positioning): Full assist, staff holds infant at breast  LATCH Score: 4   Lactation Tools Discussed/Used Tools: Pump;Flanges;Bottle Flange Size: 21 Breast pump type: Double-Electric Breast Pump Pump Education: Setup, frequency, and cleaning;Milk Storage Reason  for Pumping: Support milk supply/[redacted]w[redacted]d infant Pumping frequency: Encouraged pumping  Interventions Interventions: Breast feeding basics reviewed;Assisted with latch;Skin to skin;Breast massage;Hand express;Adjust position;Support pillows;Position options;DEBP;LC Services brochure  Discharge Pump: DEBP;Personal (Spectra 2)  Consult Status Consult Status: Follow-up Date: 01/30/22 Follow-up type: In-patient    Broadus John 01/29/2022, 10:06 AM

## 2022-01-30 NOTE — Lactation Note (Signed)
This note was copied from a baby's chart. Lactation Consultation Note  Patient Name: Bianca Mccarthy [id number] Date: 01/30/2022 Reason for consult: Follow-up assessment;Primapara;1st time breastfeeding;Late-preterm 34-36.6wks;Breastfeeding assistance;Infant weight loss (5.11% WL) Age:26 hours  LC entered the room and the infant was being held by a support person.  Per the birth parent, things have been going well.  She stated that she is still working on getting on a consistent pumping schedule.  The birth parent was in the process of pumping when [facility] entered the room.  The birth parent said that she was pumping "clear fluid", but not real milk yet.  LC let the birth parent know that the colostrum was good for the infant and encouraged her to pump every 3 hours.  She stated that the flanges fit well, but she was becoming a little sore. LC asked if she would like coconut oil.  [place] asked the RN to bring the birth parent some coconut oil.  The birth parent also stated that they had increased the volume of the infant's feeding.  LC let the birth parent know that she can continue to work on getting the infant to the breast when she leaves the hospital if that is her goal.  LC encouraged her to continue working towards her goals for feeding her baby.  The birth parent had no further questions or concerns.  The birth parent will call RN/LC for assistance with breastfeeding.   Feeding Mother's Current Feeding Choice: Breast Milk and Donor Milk Nipple Type: Extra Slow Flow  Lactation Tools Discussed/Used Pumping frequency: q3hrs  Interventions Interventions: Education;Breast feeding basics reviewed  Consult Status Consult Status: Follow-up Date: 01/31/22 Follow-up type: In-patient   Bianca Mccarthy 01/30/2022, 11:58 AM

## 2022-01-30 NOTE — Progress Notes (Signed)
PPD # 1  Status post Magnesium for Preeclampsia  Patient is doing well - headache is resolved  BP 118/66 (BP Location: Left Arm)   Pulse 97   Temp 98.5 F (36.9 C) (Oral)   Resp 18   Ht 5\' 4"  (1.626 m)   Wt 103.9 kg   LMP 05/26/2021   SpO2 99%   Breastfeeding Unknown   BMI 39.31 kg/m  No results found for this or any previous visit (from the past 24 hour(s)). Abdomen is soft and non tender  Lochia WNL No results found for this or any previous visit (from the past 24 hour(s)).  IMPRESSION: PPD # 1  Preeclampsia   PLAN: Doing well Discharge home tomorrow

## 2022-01-30 NOTE — Anesthesia Postprocedure Evaluation (Signed)
Anesthesia Post Note  Patient: Bianca Mccarthy  Procedure(s) Performed: AN AD [place]     Patient location during evaluation: Mother Baby Anesthesia Type: Epidural Level of consciousness: awake and alert and oriented Pain management: satisfactory to patient Vital Signs Assessment: post-procedure vital signs reviewed and stable Respiratory status: respiratory function stable Cardiovascular status: stable Postop Assessment: no headache, no backache, epidural receding, patient able to bend at knees, no signs of nausea or vomiting and adequate PO intake Anesthetic complications: no   No notable events documented.  Last Vitals:  Vitals:   01/30/22 1143 01/30/22 1504  BP: 124/68 139/78  Pulse: 100 (!) 107  Resp: 18 18  Temp: 37 C 37 C  SpO2: 100% 98%    Last Pain:  Vitals:   01/30/22 1504  TempSrc: Oral  PainSc:    Pain Goal: Patients Stated Pain Goal: 2 (01/30/22 0612)                 Katherina Mires

## 2022-01-31 ENCOUNTER — Other Ambulatory Visit: Payer: Self-pay

## 2022-01-31 LAB — COMPREHENSIVE METABOLIC PANEL
ALT: 14 U/L (ref 0–44)
AST: 18 U/L (ref 15–41)
Albumin: 2.4 g/dL — ABNORMAL LOW (ref 3.5–5.0)
Alkaline Phosphatase: 116 U/L (ref 38–126)
Anion gap: 8 (ref 5–15)
BUN: 8 mg/dL (ref 6–20)
CO2: 23 mmol/L (ref 22–32)
Calcium: 8.3 mg/dL — ABNORMAL LOW (ref 8.9–10.3)
Chloride: 104 mmol/L (ref 98–111)
Creatinine, Ser: 0.68 mg/dL (ref 0.44–1.00)
GFR, Estimated: >60 mL/min (ref >60)
Glucose, Bld: 78 mg/dL (ref 70–99)
Potassium: 4.1 mmol/L (ref 3.5–5.1)
Sodium: 135 mmol/L (ref 135–145)
Total Bilirubin: 0.3 mg/dL (ref 0.3–1.2)
Total Protein: 5.2 g/dL — ABNORMAL LOW (ref 6.5–8.1)

## 2022-01-31 LAB — CBC
HCT: 27.2 % — ABNORMAL LOW (ref 36.0–46.0)
Hemoglobin: 8.3 g/dL — ABNORMAL LOW (ref 12.0–15.0)
MCH: 24.5 pg — ABNORMAL LOW (ref 26.0–34.0)
MCHC: 30.5 g/dL (ref 30.0–36.0)
MCV: 80.2 fL (ref 80.0–100.0)
Platelets: 217 10*3/uL (ref 150–400)
RBC: 3.39 MIL/uL — ABNORMAL LOW (ref 3.87–5.11)
RDW: 15.1 % (ref 11.5–15.5)
WBC: 9.3 10*3/uL (ref 4.0–10.5)
nRBC: 0 % (ref 0.0–0.2)

## 2022-01-31 MED ORDER — SENNOSIDES-DOCUSATE SODIUM 8.6-50 MG PO TABS: 2.0000 | ORAL_TABLET | Freq: Every day | ORAL | 1 refills | Status: DC

## 2022-01-31 MED ORDER — IBUPROFEN 600 MG PO TABS: 600.0000 mg | ORAL_TABLET | Freq: Four times a day (QID) | ORAL | 0 refills | Status: DC

## 2022-01-31 MED ORDER — ACETAMINOPHEN 325 MG PO TABS: 650.0000 mg | ORAL_TABLET | ORAL | 1 refills | Status: DC | PRN

## 2022-01-31 NOTE — Discharge Summary (Signed)
Postpartum Discharge Summary  Date of Service updated 01/31/2022     Patient Name: Bianca Mccarthy DOB: [birth year] MRN: [id number]  Date of admission: 01/27/2022 Delivery date:01/29/2022  Delivering provider: Dian Queen  Date of discharge: 01/31/2022  Admitting diagnosis: Preeclampsia [O14.90] NSVD (normal spontaneous vaginal delivery) [O80] Intrauterine pregnancy: [redacted]w[redacted]d     Secondary diagnosis:  Principal Problem:   Preeclampsia Active Problems:   NSVD (normal spontaneous vaginal delivery)  Additional problems: Severe preeclampsia, gestational diabetes    Discharge diagnosis: Term Pregnancy Delivered, Preeclampsia (severe), and GDM A2                                              Post partum procedures: none Augmentation: AROM, Pitocin, and Cytotec Complications: None  Hospital course: Induction of Labor With Vaginal Delivery   27 y.o. yo G1P0101 at [redacted]w[redacted]d was admitted to the hospital 01/27/2022 for induction of labor.  Indication for induction: Preeclampsia.  Patient had an labor course that was uncomplicated Membrane Rupture Time/Date: 7:37 AM ,01/28/2022   Delivery Method:Vaginal, Spontaneous  Episiotomy: None  Lacerations:  2nd degree;Perineal  Details of delivery can be found in separate delivery note.  Patient had a postpartum course that was uncomplicated. She remained on magnesium for 24 hours postpartum for prophylaxis. Her Bps were normotensive postpartum without the use of any antihypertensives. Patient is discharged home 01/31/22.  Newborn Data: Birth date:01/29/2022  Birth time:2:54 AM  Gender:Female  Living status:Living  Apgars:8 ,8  Weight:3230 g   Magnesium Sulfate received: Yes: Seizure prophylaxis BMZ received: No Rhophylac:No, baby also rh neg MMR:No T-DaP:Given prenatally Flu: No Transfusion:No  Physical exam  Vitals:   01/30/22 2240 01/31/22 0346 01/31/22 0848 01/31/22 1134  BP: 125/68 112/63 123/77 (!) 111/54  Pulse: (!) 101 88  95 95  Resp: 17 15 16 16   Temp: 98.9 F (37.2 C) 97.9 F (36.6 C) 98 F (36.7 C) 98 F (36.7 C)  TempSrc: Oral Oral Oral Oral  SpO2: 98% 98% 99% 99%  Weight:      Height:       General: alert, cooperative, and no distress Lochia: appropriate Uterine Fundus: firm Incision: N/A DVT Evaluation: No evidence of DVT seen on physical exam. Labs: Lab Results  Component Value Date   WBC 9.3 01/31/2022   HGB 8.3 (L) 01/31/2022   HCT 27.2 (L) 01/31/2022   MCV 80.2 01/31/2022   PLT 217 01/31/2022      Latest Ref Rng & Units 01/31/2022    8:58 AM  CMP  Glucose 70 - 99 mg/dL 78   BUN 6 - 20 mg/dL 8   Creatinine 0.44 - 1.00 mg/dL 0.68   Sodium 135 - 145 mmol/L 135   Potassium 3.5 - 5.1 mmol/L 4.1   Chloride 98 - 111 mmol/L 104   CO2 22 - 32 mmol/L 23   Calcium 8.9 - 10.3 mg/dL 8.3   Total Protein 6.5 - 8.1 g/dL 5.2   Total Bilirubin 0.3 - 1.2 mg/dL 0.3   Alkaline Phos 38 - 126 U/L 116   AST 15 - 41 U/L 18   ALT 0 - 44 U/L 14    Edinburgh Score:    01/29/2022   11:03 PM  Edinburgh Postnatal Depression Scale Screening Tool  I have been able to laugh and see the funny side of things. 0  I have  looked forward with enjoyment to things. 0  I have blamed myself unnecessarily when things went wrong. 1  I have been anxious or worried for no good reason. 0  I have felt scared or panicky for no good reason. 1  Things have been getting on top of me. 0  I have been so unhappy that I have had difficulty sleeping. 0  I have felt sad or miserable. 0  I have been so unhappy that I have been crying. 0  The thought of harming myself has occurred to me. 0  Edinburgh Postnatal Depression Scale Total 2      After visit meds:  Allergies as of 01/31/2022   No Known Allergies      Medication List     STOP taking these medications    glyBURIDE 2.5 MG tablet Commonly known as: DIABETA   labetalol 200 MG tablet Commonly known as: NORMODYNE   promethazine 12.5 MG tablet Commonly  known as: PHENERGAN       TAKE these medications    acetaminophen 325 MG tablet Commonly known as: Tylenol Take 2 tablets (650 mg total) by mouth every 4 (four) hours as needed (for pain scale < 4). What changed:  medication strength how much to take when to take this reasons to take this   cyclobenzaprine 10 MG tablet Commonly known as: FLEXERIL Take 1 tablet (10 mg total) by mouth 3 (three) times daily as needed (headache).   ibuprofen 600 MG tablet Commonly known as: ADVIL Take 1 tablet (600 mg total) by mouth every 6 (six) hours.   PRENATAL VITAMIN PO Take 1 tablet by mouth daily.   senna-docusate 8.6-50 MG tablet Commonly known as: Senokot-S Take 2 tablets by mouth daily. Start taking on: February 01, 2022         Discharge home in stable condition Infant Feeding: Bottle and Breast Infant Disposition:home with mother Discharge instruction: per After Visit Summary and Postpartum booklet. Activity: Advance as tolerated. Pelvic rest for 6 weeks.  Diet: routine diet Anticipated Birth Control: Unsure Postpartum Appointment: 6 week pp visit Additional Postpartum F/U: BP check 2-3 days Future Appointments:No future appointments.  01/31/2022 Charlotta Newton, MD

## 2022-01-31 NOTE — Progress Notes (Signed)
Postpartum Progress Note  S: No complaints. Feeling well. Lochia appropriate. No subjective fevers/chills. No vision changes, headache has resolved. No RUQ pain.  O:     01/31/2022    3:46 AM 01/30/2022   10:40 PM 01/30/2022    7:34 PM  Vitals with BMI  Systolic 112 125 403  Diastolic 63 68 69  Pulse 88 101 106   Gen: NAD, A&O Pulm: NWOB Abd: soft, appropriately ttp, fundus firm and below Umb Ext: No evidence of DVT, mild bilateral edema  Labs Recent Results (from the past 2160 hour(s))  CBC     Status: Abnormal   Collection Time: 12/17/21 12:04 PM  Result Value Ref Range   WBC 10.0 4.0 - 10.5 K/uL   RBC 3.99 3.87 - 5.11 MIL/uL   Hemoglobin 10.7 (L) 12.0 - 15.0 g/dL   HCT 47.4 (L) 25.9 - 56.3 %   MCV 80.5 80.0 - 100.0 fL   MCH 26.8 26.0 - 34.0 pg   MCHC 33.3 30.0 - 36.0 g/dL   RDW 87.5 64.3 - 32.9 %   Platelets 240 150 - 400 K/uL   nRBC 0.0 0.0 - 0.2 %    Comment: Performed at [facility], [address], [place], [place] [address]  Comprehensive metabolic panel     Status: Abnormal   Collection Time: 12/17/21 12:04 PM  Result Value Ref Range   Sodium 133 (L) 135 - 145 mmol/L   Potassium 4.2 3.5 - 5.1 mmol/L   Chloride 103 98 - 111 mmol/L   CO2 22 22 - 32 mmol/L   Glucose, Bld 104 (H) 70 - 99 mg/dL    Comment: Glucose reference range applies only to samples taken after fasting for at least 8 hours.   BUN 5 (L) 6 - 20 mg/dL   Creatinine, Ser 1.66 0.44 - 1.00 mg/dL   Calcium 8.5 (L) 8.9 - 10.3 mg/dL   Total Protein 5.6 (L) 6.5 - 8.1 g/dL   Albumin 2.7 (L) 3.5 - 5.0 g/dL   AST 14 (L) 15 - 41 U/L   ALT 13 0 - 44 U/L   Alkaline Phosphatase 89 38 - 126 U/L   Total Bilirubin 0.2 (L) 0.3 - 1.2 mg/dL   GFR, Estimated >06 >30 mL/min    Comment: (NOTE) Calculated using the CKD-EPI Creatinine Equation (2021)    Anion gap 8 5 - 15    Comment: Performed at [facility], [address], [place], [place] [address]  Protein / creatinine ratio, urine     Status:  None   Collection Time: 12/17/21 12:33 PM  Result Value Ref Range   Creatinine, Urine 143 mg/dL   Total Protein, Urine 17 mg/dL    Comment: NO NORMAL RANGE ESTABLISHED FOR THIS TEST   Protein Creatinine Ratio 0.12 0.00 - 0.15 mg/mg[Cre]    Comment: Performed at [facility], [address] [place], [place] [address]  Protein / creatinine ratio, urine     Status: None   Collection Time: 01/27/22 10:29 PM  Result Value Ref Range   Creatinine, Urine 86 mg/dL   Total Protein, Urine 12 mg/dL    Comment: NO NORMAL RANGE ESTABLISHED FOR THIS TEST   Protein Creatinine Ratio 0.14 0.00 - 0.15 mg/mg[Cre]    Comment: Performed at [facility], [address] [place], [place] [address]  Type and screen [facility]     Status: None   Collection Time: 01/27/22 11:06 PM  Result Value Ref Range   ABO/RH(D) O NEG    Antibody Screen POS    Sample Expiration 01/30/2022,2359    Antibody Identification      PASSIVELY ACQUIRED ANTI-D Performed at [facility], [address], [place], [place] [address]   CBC with Differential/Platelet     Status: Abnormal   Collection Time: 01/27/22 11:10 PM  Result Value Ref Range   WBC 9.8 4.0 - 10.5 K/uL   RBC 4.03 3.87 - 5.11 MIL/uL   Hemoglobin 9.8 (L) 12.0 - 15.0 g/dL   HCT 63.8 (L) 75.6 - 43.3 %   MCV 77.7 (L) 80.0 - 100.0 fL   MCH 24.3 (L) 26.0 - 34.0 pg   MCHC 31.3 30.0 - 36.0 g/dL   RDW 29.5 18.8 - 41.6 %   Platelets 211 150 - 400 K/uL   nRBC 0.2 0.0 - 0.2 %   Neutrophils Relative % 69 %   Neutro Abs 6.8 1.7 - 7.7 K/uL   Lymphocytes Relative 19 %   Lymphs Abs 1.8 0.7 - 4.0 K/uL   Monocytes Relative 9 %   Monocytes Absolute 0.9 0.1 - 1.0 K/uL   Eosinophils Relative 1 %   Eosinophils Absolute 0.1 0.0 - 0.5 K/uL   Basophils Relative 0 %   Basophils Absolute 0.0 0.0 - 0.1 K/uL   Immature Granulocytes 2 %   Abs Immature Granulocytes 0.15 (H) 0.00 - 0.07 K/uL    Comment: Performed at [facility], [address] [place], [place] [address]  Comprehensive metabolic panel     Status: Abnormal   Collection Time: 01/27/22 11:10 PM  Result Value Ref Range   Sodium 136 135 - 145 mmol/L   Potassium 4.2 3.5 - 5.1 mmol/L   Chloride 105 98 - 111 mmol/L   CO2 23 22 - 32 mmol/L   Glucose, Bld 111 (H) 70 - 99 mg/dL    Comment: Glucose reference range applies only to samples taken after fasting for at least 8 hours.   BUN 5 (L) 6 - 20 mg/dL   Creatinine, Ser 1.60 0.44 - 1.00 mg/dL   Calcium 8.9 8.9 - 10.9 mg/dL   Total Protein 5.4 (L) 6.5 - 8.1 g/dL   Albumin 2.7 (L) 3.5 - 5.0 g/dL   AST 19 15 - 41 U/L   ALT 11 0 - 44 U/L   Alkaline Phosphatase 136 (H) 38 - 126 U/L   Total Bilirubin 0.3 0.3 - 1.2 mg/dL   GFR, Estimated >32 >35 mL/min    Comment: (NOTE) Calculated using the CKD-EPI Creatinine Equation (2021)    Anion gap 8 5 - 15    Comment: Performed at [facility], [address] [place], [place] [address]  Glucose, capillary     Status: Abnormal   Collection Time: 01/28/22  1:02 AM  Result Value Ref Range   Glucose-Capillary 114 (H) 70 - 99 mg/dL    Comment: Glucose reference range applies only to samples taken after fasting for at least 8 hours.  Glucose, capillary     Status: None   Collection Time: 01/28/22  5:21 AM  Result Value Ref Range   Glucose-Capillary 94 70 - 99 mg/dL    Comment: Glucose reference range applies only to samples taken after fasting for at least 8 hours.  CBC     Status: Abnormal   Collection Time: 01/28/22  8:45 AM  Result Value Ref Range   WBC 9.6 4.0 - 10.5  K/uL   RBC 3.88 3.87 - 5.11 MIL/uL   Hemoglobin 9.6 (L) 12.0 - 15.0 g/dL   HCT 30.2 (L) 36.0 - 46.0 %   MCV 77.8 (L) 80.0 - 100.0 fL   MCH 24.7 (L) 26.0 - 34.0 pg   MCHC 31.8 30.0 - 36.0 g/dL   RDW 14.9 11.5 - 15.5 %   Platelets 209 150 - 400 K/uL   nRBC 0.0 0.0 - 0.2 %    Comment: Performed at [facility] [address], [place], [place] [address]  Glucose, capillary     Status: None    Collection Time: 01/28/22  9:43 AM  Result Value Ref Range   Glucose-Capillary 95 70 - 99 mg/dL    Comment: Glucose reference range applies only to samples taken after fasting for at least 8 hours.  Glucose, capillary     Status: None   Collection Time: 01/28/22  2:58 PM  Result Value Ref Range   Glucose-Capillary 91 70 - 99 mg/dL    Comment: Glucose reference range applies only to samples taken after fasting for at least 8 hours.  Glucose, capillary     Status: Abnormal   Collection Time: 01/28/22  6:59 PM  Result Value Ref Range   Glucose-Capillary 101 (H) 70 - 99 mg/dL    Comment: Glucose reference range applies only to samples taken after fasting for at least 8 hours.  Glucose, capillary     Status: Abnormal   Collection Time: 01/28/22 11:01 PM  Result Value Ref Range   Glucose-Capillary 115 (H) 70 - 99 mg/dL    Comment: Glucose reference range applies only to samples taken after fasting for at least 8 hours.  CBC     Status: Abnormal   Collection Time: 01/29/22  5:16 AM  Result Value Ref Range   WBC 17.4 (H) 4.0 - 10.5 K/uL   RBC 3.90 3.87 - 5.11 MIL/uL   Hemoglobin 9.8 (L) 12.0 - 15.0 g/dL   HCT 29.8 (L) 36.0 - 46.0 %   MCV 76.4 (L) 80.0 - 100.0 fL   MCH 25.1 (L) 26.0 - 34.0 pg   MCHC 32.9 30.0 - 36.0 g/dL   RDW 14.9 11.5 - 15.5 %   Platelets 231 150 - 400 K/uL   nRBC 0.0 0.0 - 0.2 %    Comment: Performed at [facility], [place] [address], [place], [place] [address]  Comprehensive metabolic panel     Status: Abnormal   Collection Time: 01/29/22  5:16 AM  Result Value Ref Range   Sodium 133 (L) 135 - 145 mmol/L   Potassium 4.0 3.5 - 5.1 mmol/L   Chloride 103 98 - 111 mmol/L   CO2 21 (L) 22 - 32 mmol/L   Glucose, Bld 126 (H) 70 - 99 mg/dL    Comment: Glucose reference range applies only to samples taken after fasting for at least 8 hours.   BUN <5 (L) 6 - 20 mg/dL   Creatinine, Ser 0.76 0.44 - 1.00 mg/dL   Calcium 7.8 (L) 8.9 - 10.3 mg/dL   Total Protein 5.2  (L) 6.5 - 8.1 g/dL   Albumin 2.5 (L) 3.5 - 5.0 g/dL   AST 27 15 - 41 U/L   ALT 13 0 - 44 U/L   Alkaline Phosphatase 130 (H) 38 - 126 U/L   Total Bilirubin 0.8 0.3 - 1.2 mg/dL   GFR, Estimated >60 >60 mL/min    Comment: (NOTE) Calculated using the CKD-EPI Creatinine  Equation (2021)    Anion gap 9 5 - 15    Comment: Performed at [facility], [place] [address], [place],  [address]     A/P:  PPD2 s/p SVD, doing well pp. AFVSS. Benign exam.   Preeclampsia with severe features - Bps have been normal since delivery on no antihypertensives, asx. Will recheck labs today. We discussed if wnl, okay for discharge with BP check in the office later this week. She has BP cuff at home, we discussed parameters.  GDMA2 - BG wnl during admission  Dispo: likely dc later today if Bps remain normal and labs wnl  Hurshel Party, MD

## 2022-01-31 NOTE — Lactation Note (Addendum)
This note was copied from a baby's chart. Lactation Consultation Note  Patient Name: Bianca Mccarthy KMMNO'T Date: 01/31/2022 Reason for consult: Follow-up assessment;1st time breastfeeding;Primapara;Late-preterm 34-36.6wks Age:26 hours  P1, 35.[redacted] weeks gestation, 11% weight loss  Infant is improving with her bottle feeding intake. Infant is tolerating feedings of donor breast milk, taking 24-35 ml per feeding. Mother is pumping and expressed 3 ml that she is adding to infant's feeding, currently giving by syringe orally. Mother states she has not put baby to breast due "tongue tie" and wanting her to get more volume. Mother will be supplementing with formula at home.  Mother has electric breast pumps at home. She inquired about Symphony pump that she using in the hospital, informed can be rented through the gift shop.   Baby is off phototherapy and mother states she is hopeful for discharge home this afternoon. Discussed follow up lactation visit and support. Mother would like referral to made for OP LC to follow up with her.   Mother will continue with current feeding, pumping, supplementing plan.    Feeding Mother's Current Feeding Choice: Breast Milk and Donor Milk Nipple Type: Slow - flow    Discharge Discharge Education: Engorgement and breast care;Warning signs for feeding baby  Consult Status Consult Status: Complete Date: 01/31/22    Gwenevere Abbot 01/31/2022, 11:49 AM

## 2022-01-31 NOTE — Plan of Care (Signed)
  Problem: Education: Goal: Knowledge of disease or condition will improve Outcome: Adequate for Discharge Goal: Knowledge of the prescribed therapeutic regimen will improve Outcome: Adequate for Discharge   Problem: Fluid Volume: Goal: Peripheral tissue perfusion will improve Outcome: Adequate for Discharge   Problem: Clinical Measurements: Goal: Complications related to disease process, condition or treatment will be avoided or minimized Outcome: Adequate for Discharge   Problem: Health Behavior/Discharge Planning: Goal: Ability to manage health-related needs will improve Outcome: Adequate for Discharge   Problem: Clinical Measurements: Goal: Ability to maintain clinical measurements within normal limits will improve Outcome: Adequate for Discharge Goal: Will remain free from infection Outcome: Adequate for Discharge Goal: Diagnostic test results will improve Outcome: Adequate for Discharge Goal: Respiratory complications will improve Outcome: Adequate for Discharge Goal: Cardiovascular complication will be avoided Outcome: Adequate for Discharge   Problem: Activity: Goal: Risk for activity intolerance will decrease Outcome: Adequate for Discharge   Problem: Nutrition: Goal: Adequate nutrition will be maintained Outcome: Adequate for Discharge   Problem: Coping: Goal: Level of anxiety will decrease Outcome: Adequate for Discharge   Problem: Elimination: Goal: Will not experience complications related to bowel motility Outcome: Adequate for Discharge Goal: Will not experience complications related to urinary retention Outcome: Adequate for Discharge   Problem: Pain Managment: Goal: General experience of comfort will improve Outcome: Adequate for Discharge   Problem: Safety: Goal: Ability to remain free from injury will improve Outcome: Adequate for Discharge   Problem: Skin Integrity: Goal: Risk for impaired skin integrity will decrease Outcome: Adequate for  Discharge   Problem: Education: Goal: Knowledge of condition will improve Outcome: Adequate for Discharge Goal: Individualized Educational Video(s) Outcome: Adequate for Discharge Goal: Individualized Newborn Educational Video(s) Outcome: Adequate for Discharge   Problem: Activity: Goal: Will verbalize the importance of balancing activity with adequate rest periods Outcome: Adequate for Discharge Goal: Ability to tolerate increased activity will improve Outcome: Adequate for Discharge   Problem: Coping: Goal: Ability to identify and utilize available resources and services will improve Outcome: Adequate for Discharge   Problem: Life Cycle: Goal: Chance of risk for complications during the postpartum period will decrease Outcome: Adequate for Discharge   Problem: Role Relationship: Goal: Ability to demonstrate positive interaction with newborn will improve Outcome: Adequate for Discharge   Problem: Skin Integrity: Goal: Demonstration of wound healing without infection will improve Outcome: Adequate for Discharge

## 2022-01-31 NOTE — Plan of Care (Signed)
  Problem: Coping: Goal: Ability to verbalize concerns and feelings about labor and delivery will improve Outcome: Completed/Met   Problem: Life Cycle: Goal: Ability to make normal progression through stages of labor will improve Outcome: Completed/Met Goal: Ability to effectively push during vaginal delivery will improve Outcome: Completed/Met   Problem: Role Relationship: Goal: Will demonstrate positive interactions with the child Outcome: Completed/Met   Problem: Safety: Goal: Risk of complications during labor and delivery will decrease Outcome: Completed/Met   Problem: Pain Management: Goal: Relief or control of pain from uterine contractions will improve Outcome: Completed/Met   Problem: Education: Goal: Knowledge of General Education information will improve Description: Including pain rating scale, medication(s)/side effects and non-pharmacologic comfort measures Outcome: Completed/Met

## 2022-02-01 ENCOUNTER — Ambulatory Visit: Payer: Self-pay

## 2022-02-01 NOTE — Lactation Note (Signed)
This note was copied from a baby's chart. Lactation Consultation Note  Patient Name: Bianca Mccarthy MBWGY'K Date: 02/01/2022 Reason for consult: Initial assessment;Primapara;Late-preterm 34-36.6wks;Hyperbilirubinemia Age:26 days Baby re-admitted to hospital for hyperbilirubinemia. Mom was using her personal DEBP when Galateo came in. Encouraged mom to massage breast during pumping since she is only pumping one breast at a time. Instructions on how to make a hands free bra. Mom is pumping and bottle feeding until she gets her tongue tie repaired. Discussed pumping and managing engorgement and milk storage. Encouraged to call for questions or concerns. Encouraged to call for OP LC appt. W/Sharon Hice in am.  Maternal Data    Feeding Nipple Type: Other (bottle from home)  [facility] Score                    Lactation Tools Discussed/Used Tools: Pump Breast pump type: Double-Electric Breast Pump;Other (comment) (mom's personal pump) Pumping frequency: q3hr  Interventions Interventions: Breast compression;Breast massage  Discharge Discharge Education: Outpatient recommendation  Consult Status Consult Status: Follow-up Date: 02/02/22 Follow-up type: In-patient    Bianca Mccarthy 02/01/2022, 8:23 PM

## 2022-02-09 ENCOUNTER — Telehealth (HOSPITAL_COMMUNITY): Payer: Self-pay | Admitting: *Deleted

## 2022-02-09 NOTE — Telephone Encounter (Signed)
Voicemail not setup. Unable to leave message.  Odis Hollingshead, RN 02-09-2022 at 3:58pm

## 2022-02-16 ENCOUNTER — Inpatient Hospital Stay (HOSPITAL_COMMUNITY): Payer: Commercial Managed Care - PPO

## 2022-02-16 ENCOUNTER — Inpatient Hospital Stay (HOSPITAL_COMMUNITY)
Admission: AD | Admit: 2022-02-16 | Payer: Commercial Managed Care - PPO | Source: Home / Self Care | Admitting: Obstetrics and Gynecology

## 2022-02-17 ENCOUNTER — Telehealth: Payer: Self-pay | Admitting: Lactation Services

## 2022-02-17 NOTE — Telephone Encounter (Signed)
Opened in error

## 2022-10-26 IMAGING — CT CT CERVICAL SPINE W/O CM
2 series · 10 of 14 positions shown, 12 images · non-contrast
Comparison: None.

CLINICAL DATA: Syncope

EXAM:
CT CERVICAL SPINE WITHOUT CONTRAST
TECHNIQUE: Multidetector CT imaging of the cervical spine was performed without
intravenous contrast. Multiplanar CT image reconstructions were also
generated.

[Series 4: c spine soft · axial · 0.38mm/px · z∈[-278,-166]mm · 5 of 85 slices shown]
[im 15/85  soft-tissue]
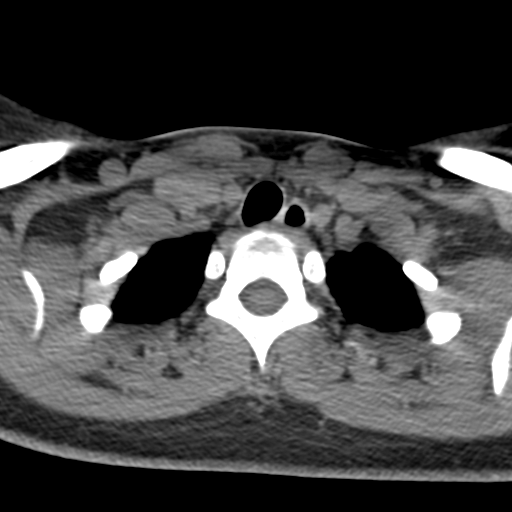
[im 29/85  soft-tissue]
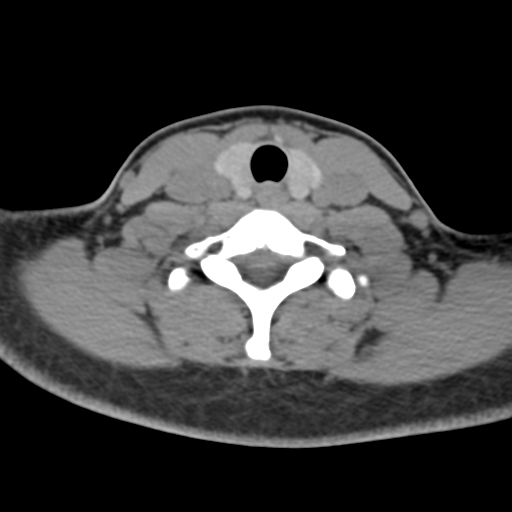
[im 43/85  soft-tissue]
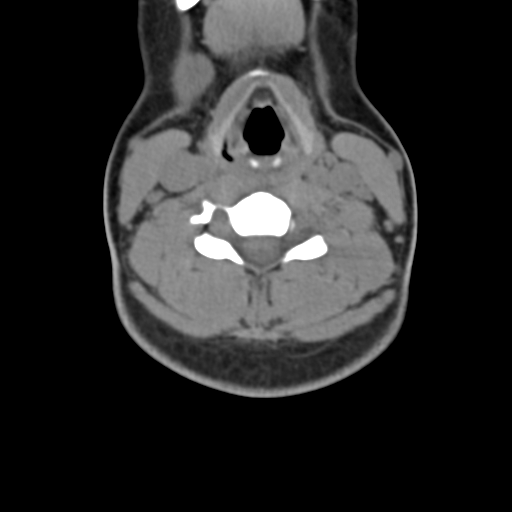
[im 57/85  soft-tissue]
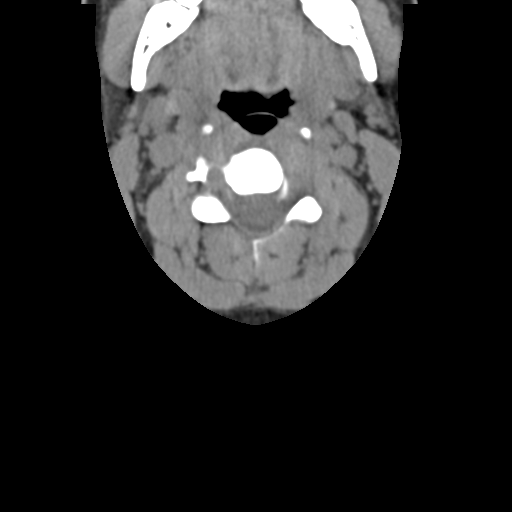
[im 71/85  soft-tissue]
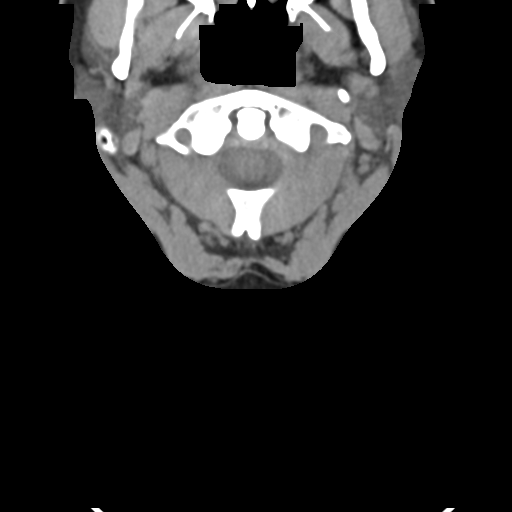

[Series 7: orthogonal axials · axial · 0.22mm/px · z∈[-291,-191]mm · 5 of 96 slices shown, 7 images]
[im 16/96  soft-tissue]
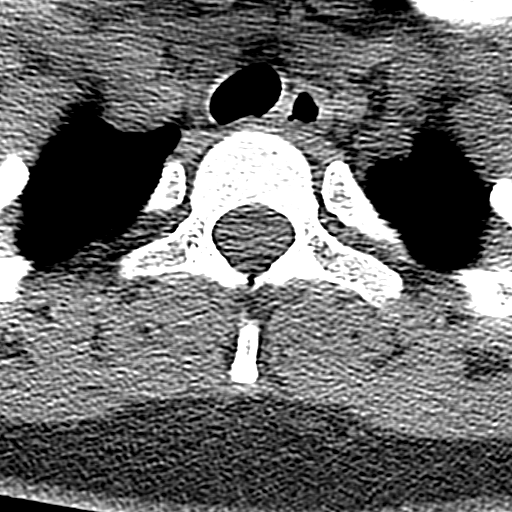
[im 16/96  bone]
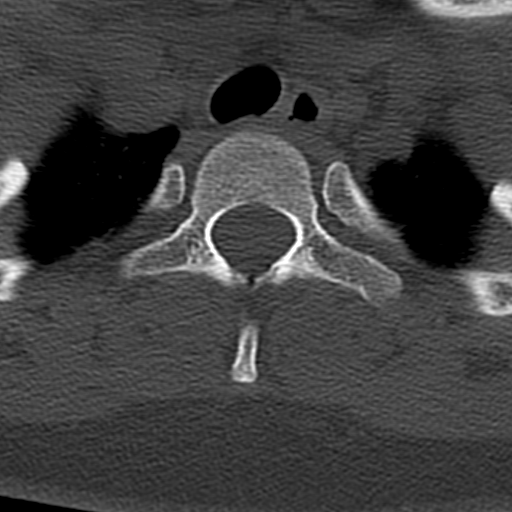
[im 32/96  bone]
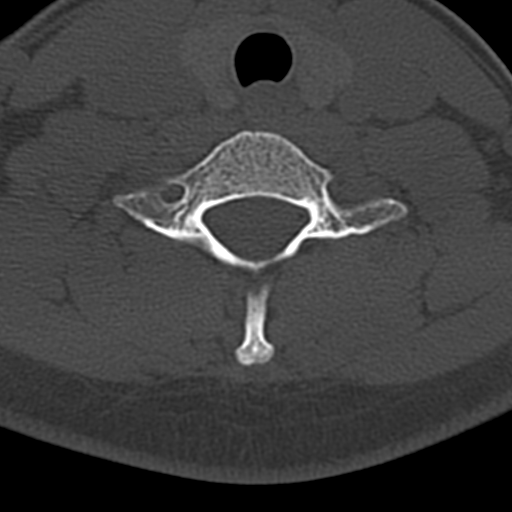
[im 48/96  bone]
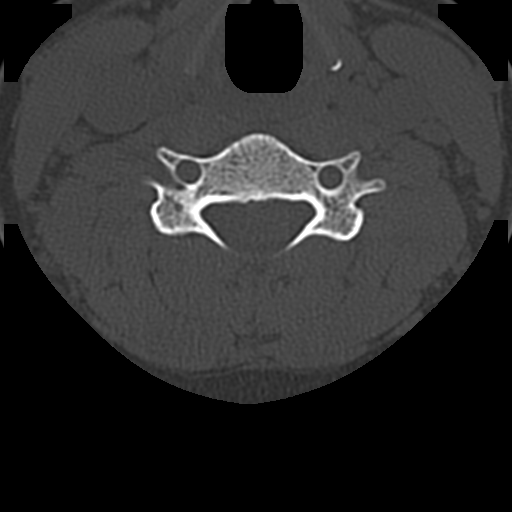
[im 64/96  bone]
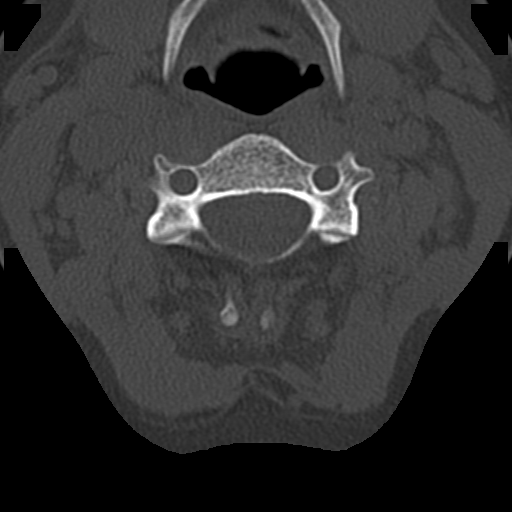
[im 80/96  soft-tissue]
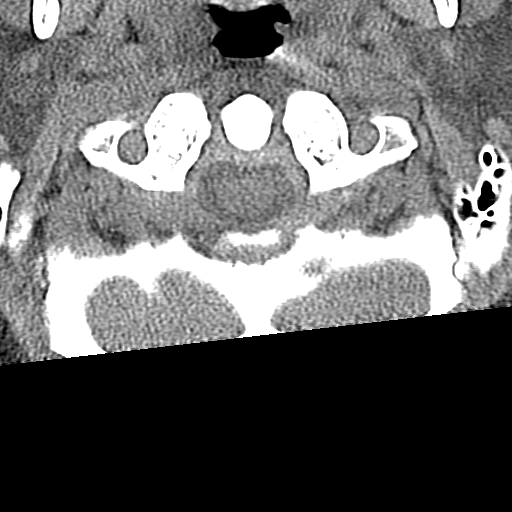
[im 80/96  bone]
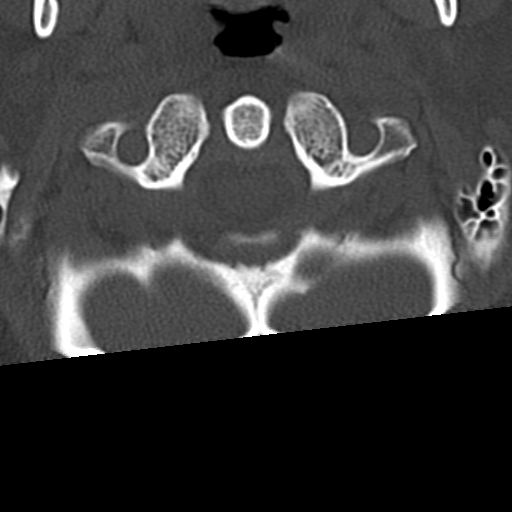

[10 of 14 positions shown; findings below may reference images not displayed]

FINDINGS: Alignment: Loss of cervical lordosis. Slight anterolisthesis of C3
on C4 and C4 on C5.

Skull base and vertebrae: No fracture or focal bone lesion.

Soft tissues and spinal canal: No prevertebral fluid or swelling. No
visible canal hematoma.

Disc levels:  Maintained

Upper chest: Negative

Other: None
IMPRESSION: Loss of cervical lordosis with slight anterolisthesis at C3-4 and
C4-5. This may be related to muscle spasm. Consider further
evaluation with flexion and extension views to exclude instability.

No focal bone abnormality.

## 2022-10-26 IMAGING — CT CT HEAD W/O CM
2 series · 15 of 30 positions shown, 17 images · non-contrast
Comparison: None

CLINICAL DATA: Syncope, hit head

EXAM:
CT HEAD WITHOUT CONTRAST
TECHNIQUE: Contiguous axial images were obtained from the base of the skull
through the vertex without intravenous contrast.

[Series 2: head wo · axial · 0.43mm/px · z∈[-153,-33]mm · 7 of 33 slices shown, 9 images]
[im 5/33  brain]
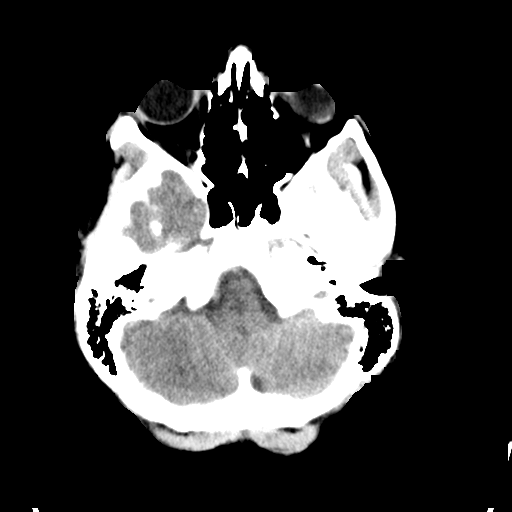
[im 5/33  bone]
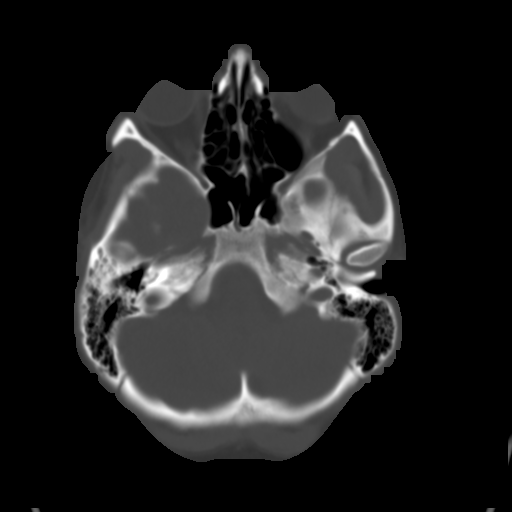
[im 9/33  brain]
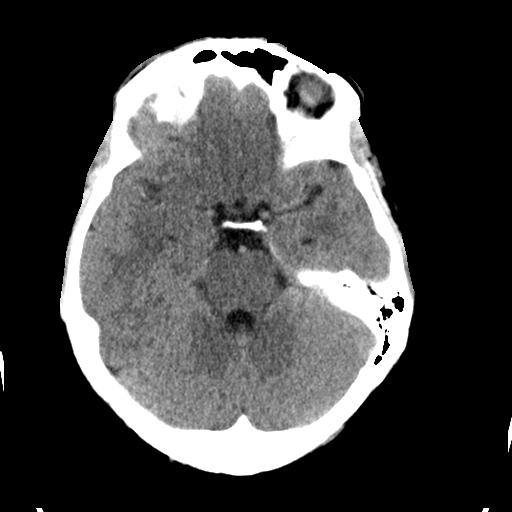
[im 13/33  brain]
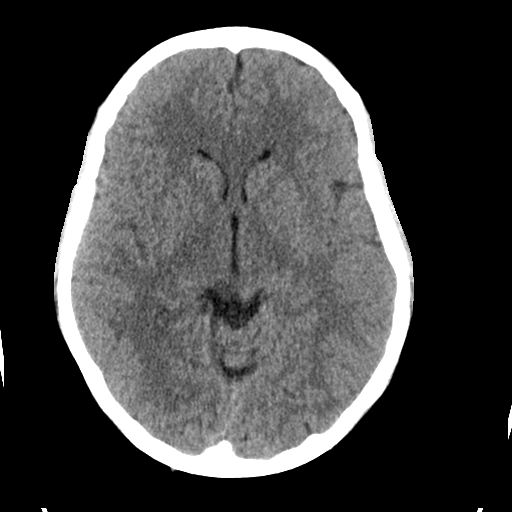
[im 17/33  brain]
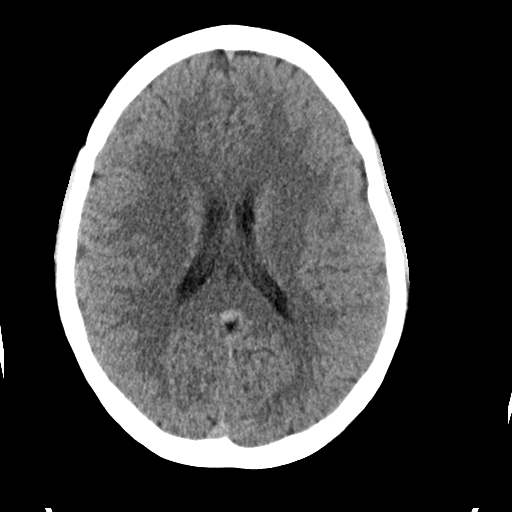
[im 21/33  brain]
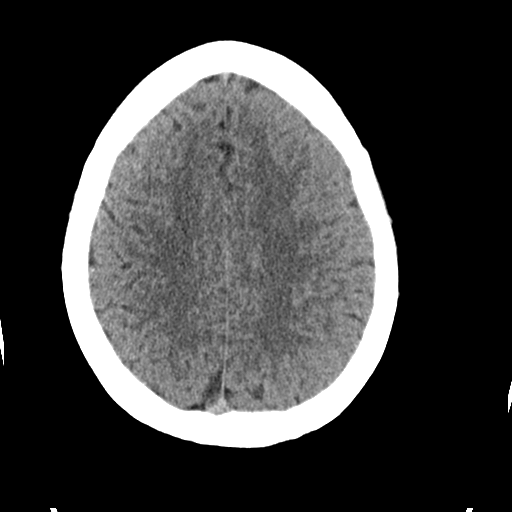
[im 21/33  bone]
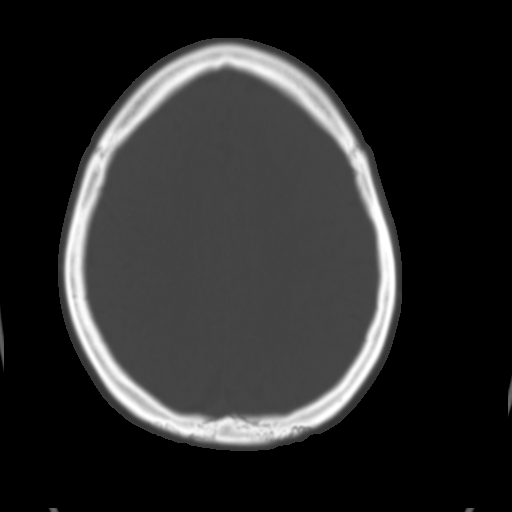
[im 25/33  brain]
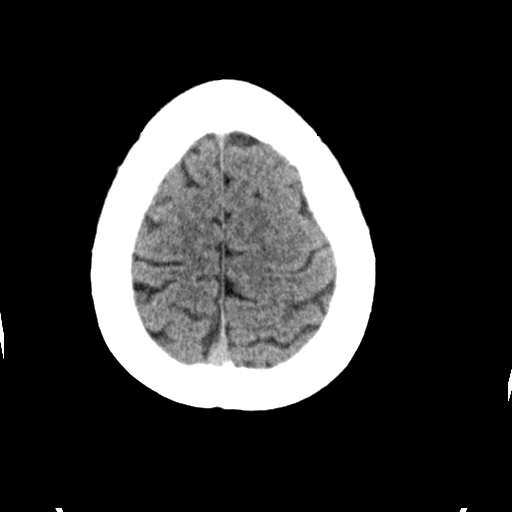
[im 29/33  brain]
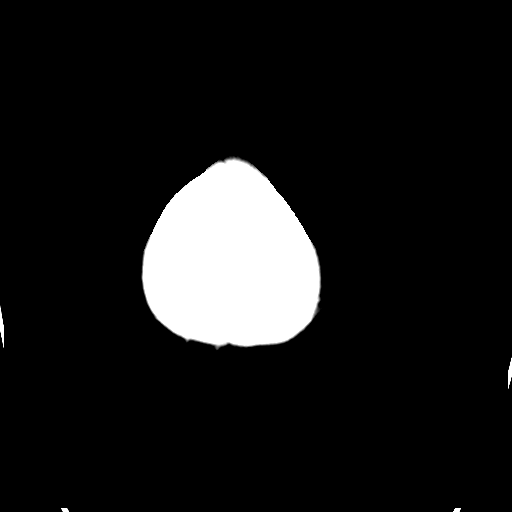

[Series 3: head bone · axial · 0.43mm/px · z∈[-157,-29]mm · 8 of 81 slices shown]
[im 9/81  bone]
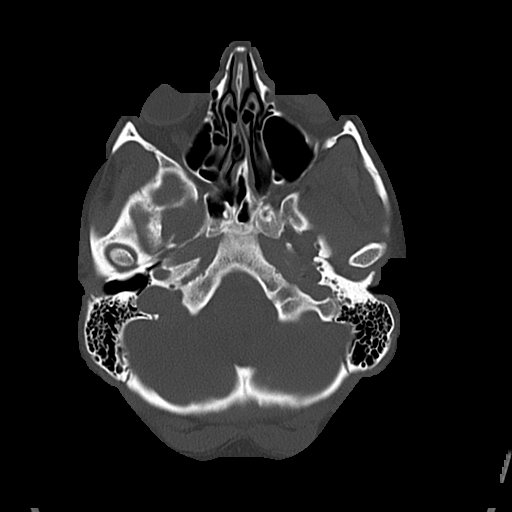
[im 17/81  bone]
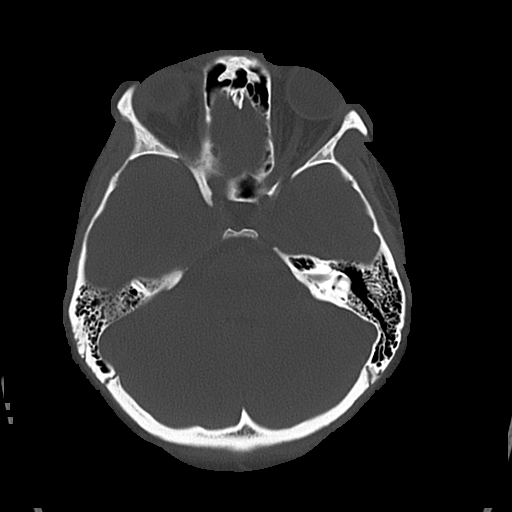
[im 25/81  bone]
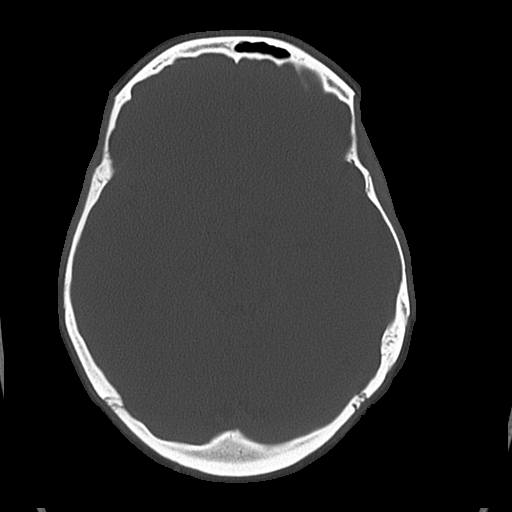
[im 37/81  bone]
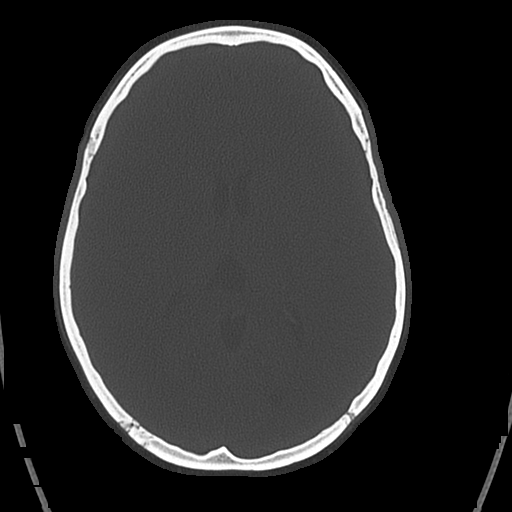
[im 45/81  bone]
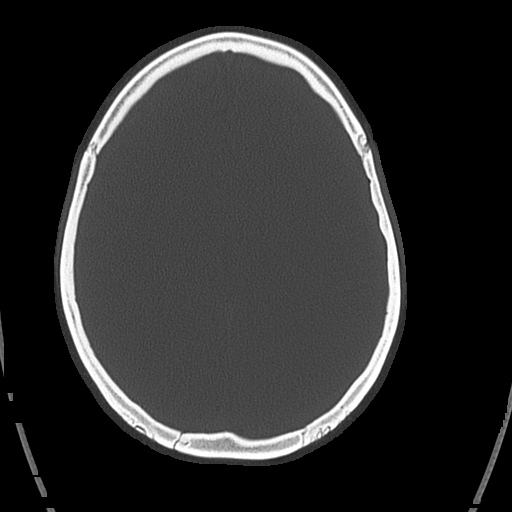
[im 57/81  bone]
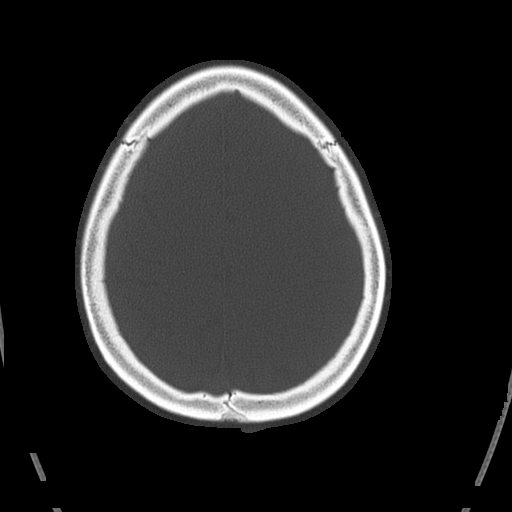
[im 65/81  bone]
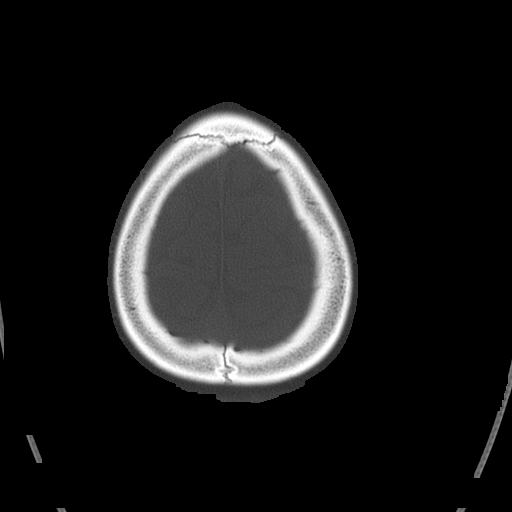
[im 73/81  bone]
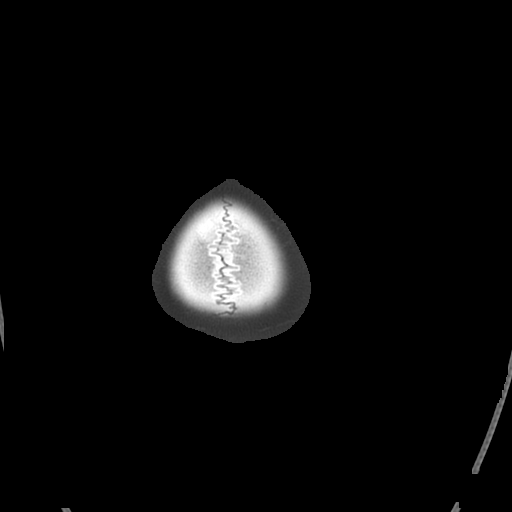

[15 of 30 positions shown; findings below may reference images not displayed]

FINDINGS: Brain: No acute intracranial abnormality. Specifically, no
hemorrhage, hydrocephalus, mass lesion, acute infarction, or
significant intracranial injury.

Vascular: No hyperdense vessel or unexpected calcification.

Skull: No acute calvarial abnormality.

Sinuses/Orbits: No acute findings

Other: None
IMPRESSION: No acute intracranial abnormality.

## 2023-03-15 ENCOUNTER — Inpatient Hospital Stay (HOSPITAL_COMMUNITY)
Admission: AD | Admit: 2023-03-15 | Discharge: 2023-03-16 | Disposition: A | Discharge status: Home / Self Care | Attending: Obstetrics and Gynecology | Admitting: Obstetrics and Gynecology

## 2023-03-15 ENCOUNTER — Encounter (HOSPITAL_COMMUNITY): Payer: Self-pay

## 2023-03-15 DIAGNOSIS — O10912 Unspecified pre-existing hypertension complicating pregnancy, second trimester: Secondary | ICD-10-CM | POA: Insufficient documentation

## 2023-03-15 DIAGNOSIS — O10919 Unspecified pre-existing hypertension complicating pregnancy, unspecified trimester: Secondary | ICD-10-CM

## 2023-03-15 DIAGNOSIS — Z3A14 14 weeks gestation of pregnancy: Secondary | ICD-10-CM | POA: Insufficient documentation

## 2023-03-15 NOTE — MAU Note (Signed)
..  Bianca Mccarthy is a 29 y.o. at [redacted]w[redacted]d here in MAU reporting: headache all day, took tylenol around 1600 and it has not helped Took blood pressure around 1700 and it was 164/92.  Had elevated blood pressure last pregnancy but it resolved quickly after delivery.  Pain score: 6/10 Vitals:   03/15/23 2246  BP: 133/79  Pulse: 98  Resp: 17  Temp: 99 F (37.2 C)  SpO2: 100%     FHT: 155 Lab orders placed from triage:  UA

## 2023-03-16 DIAGNOSIS — Z3A14 14 weeks gestation of pregnancy: Secondary | ICD-10-CM

## 2023-03-16 DIAGNOSIS — O10912 Unspecified pre-existing hypertension complicating pregnancy, second trimester: Secondary | ICD-10-CM | POA: Diagnosis present

## 2023-03-16 NOTE — MAU Provider Note (Signed)
 History     CSN: [id number]  Arrival date and time: 03/15/23 2232   None     Chief Complaint  Patient presents with   Headache   Bianca Mccarthy is a 27yo G2P0101 @ 14.5wks who reports having elevated BPs today at work and also after 30 mins of rest (value 164/92); also reports feeling dizzy w rising as well as having visual changes, which have resolved currently; has a dx of cHTN but isn't currently on antihypertensives. Reports hx of severe pre-e with first preg and was taken off of full-duty @ about 17wks. Denies abd pain or bldg or other preg concerns.    OB History     Gravida  2   Para  1   Term      Preterm  1   AB      Living  1      SAB      IAB      Ectopic      Multiple  0   Live Births  1           Past Medical History:  Diagnosis Date   Chronic hypertension     Past Surgical History:  Procedure Laterality Date   NO PAST SURGERIES      Family History  Problem Relation Age of Onset   Hypertension Mother    Hypertension Maternal Grandmother     Social History   Tobacco Use   Smoking status: Never   Smokeless tobacco: Never  Substance Use Topics   Alcohol use: Yes   Drug use: No    Allergies: No Known Allergies  Medications Prior to Admission  Medication Sig Dispense Refill Last Dose/Taking   acetaminophen (TYLENOL) 325 MG tablet Take 2 tablets (650 mg total) by mouth every 4 (four) hours as needed (for pain scale < 4). 30 tablet 1 03/14/2023   Prenatal Vit-Fe Fumarate-FA (PRENATAL VITAMIN PO) Take 1 tablet by mouth daily.   03/14/2023   cyclobenzaprine (FLEXERIL) 10 MG tablet Take 1 tablet (10 mg total) by mouth 3 (three) times daily as needed (headache). 30 tablet 0    ibuprofen (ADVIL) 600 MG tablet Take 1 tablet (600 mg total) by mouth every 6 (six) hours. 30 tablet 0    senna-docusate (SENOKOT-S) 8.6-50 MG tablet Take 2 tablets by mouth daily. 30 tablet 1     Review of Systems  All other systems reviewed and are  negative.  Physical Exam   Blood pressure 133/79, pulse 98, temperature 99 F (37.2 C), temperature source Oral, resp. rate 17, height 5\' 4"  (1.626 m), weight 98 kg, SpO2 100%, unknown if currently breastfeeding.  Physical Exam HENT:     Head: Normocephalic.  Eyes:     Extraocular Movements: Extraocular movements intact.  Pulmonary:     Effort: Pulmonary effort is normal.  Abdominal:     Comments: FHTs dopplered 155  Musculoskeletal:        General: Normal range of motion.     Cervical back: Normal range of motion.  Skin:    General: Skin is warm and dry.  Neurological:     Mental Status: She is alert.  Psychiatric:        Mood and Affect: Mood normal.        Speech: Speech normal.        Behavior: Behavior normal.     MAU Course  Procedures   Assessment and Plan  [redacted]w[redacted]d IUP cHTN w earlier ^BP S/p presyncopal episodes  #  Recommended making OB appt for 3/17; keep TID BP log between now and appt so that her BPs over time can be evaluated to determine need for meds (normal now in MAU)  #Recommended eating small amount of protein throughout the day in order to help with probable hypoglycemia  # D/C home  Arabella Merles CNM 03/16/2023, 12:07 AM
